# Patient Record
Sex: Female | Born: 1945 | Race: White | Hispanic: No | Marital: Married
Health system: Midwestern US, Community
[De-identification: ages and names within clinical notes are randomized; demographics above are authoritative.]

## PROBLEM LIST (undated history)

## (undated) DIAGNOSIS — M545 Low back pain, unspecified: Secondary | ICD-10-CM

## (undated) DIAGNOSIS — Z1211 Encounter for screening for malignant neoplasm of colon: Secondary | ICD-10-CM

## (undated) DIAGNOSIS — F17290 Nicotine dependence, other tobacco product, uncomplicated: Secondary | ICD-10-CM

## (undated) DIAGNOSIS — I6529 Occlusion and stenosis of unspecified carotid artery: Secondary | ICD-10-CM

## (undated) DIAGNOSIS — I6523 Occlusion and stenosis of bilateral carotid arteries: Secondary | ICD-10-CM

## (undated) MED ORDER — ZOSTER VACCINE LIVE (PF) 19,400 UNIT SUB-Q SOLN
19400 unit/0.65 mL | Freq: Once | SUBCUTANEOUS | Status: DC
Start: ? — End: 2012-08-15

## (undated) MED ORDER — LEVOTHYROXINE 100 MCG TAB
100 mcg | ORAL_TABLET | Freq: Every day | ORAL | Status: DC
Start: ? — End: 2012-08-15

## (undated) MED ORDER — METFORMIN 850 MG TAB: 850 mg | ORAL_TABLET | Freq: Two times a day (BID) | ORAL | Status: AC

## (undated) MED ORDER — GABAPENTIN 400 MG CAP: 400 mg | ORAL_CAPSULE | Freq: Three times a day (TID) | ORAL | Status: AC

## (undated) MED ORDER — LISINOPRIL 20 MG TAB: 20 mg | ORAL_TABLET | Freq: Every day | ORAL | Status: AC

## (undated) MED ORDER — ERGOCALCIFEROL (VITAMIN D2) 50,000 UNIT CAP
1250 mcg (50,000 unit) | ORAL_CAPSULE | ORAL | Status: DC
Start: ? — End: 2013-08-21

## (undated) MED ORDER — SIMVASTATIN 20 MG TAB
20 mg | ORAL_TABLET | Freq: Every evening | ORAL | Status: DC
Start: ? — End: 2019-10-15

## (undated) MED ORDER — OXYCODONE-ACETAMINOPHEN 5 MG-325 MG TAB
5-325 mg | ORAL_TABLET | Freq: Three times a day (TID) | ORAL | Status: DC | PRN
Start: ? — End: 2019-10-15

## (undated) MED ORDER — ASPIRIN 81 MG TAB, DELAYED RELEASE: 81 mg | ORAL_TABLET | Freq: Every day | ORAL | Status: AC

## (undated) MED ORDER — ZOSTER VACCINE LIVE (PF) 19,400 UNIT SUB-Q SOLN
19400 unit/0.65 mL | Freq: Once | SUBCUTANEOUS | Status: AC
Start: ? — End: 2012-08-15

## (undated) MED ORDER — LEVOTHYROXINE 88 MCG TAB: 88 mcg | ORAL_TABLET | Freq: Every day | ORAL | Status: AC

---

## 2012-07-30 NOTE — Progress Notes (Signed)
Chief Complaint   Patient presents with   ??? Establish Care     new patient   ??? Rash     elbow dry skin and itchy   ??? LOW BACK PAIN

## 2012-07-30 NOTE — Progress Notes (Signed)
HISTORY OF PRESENT ILLNESS  Katrina Rogers is a 67 y.o. female.  HPI  Hypothyrodism  No wt gain, no cold intolerance, no thyroid sx,  nl lipid, compliant with meds, last TSH was normal, was also told to have increased glucose level as well.  HTN  Compliant w/ meds, no low salt diet, and ++daily walking, non smoker, no home bp monitoring .No swelling no lightheadedness as per patient, not stressed out, ++ RF needed today, otherwise feeling better since the last visit  Back pain  The history is provided by the patient. This is a chronic problem. Episode onset: 3 yrs ago,not morbid obese, she is not working, she is having trouble with ironing, washing dishes and hang clothes, and it is worsens going up and down the steps . The problem occurs constantly. The problem has changed and worsening since onset. The pain is present in the lower back. The quality of the pain is described as dull. The pain is at a severity of 8/10. Associated symptoms include limited range of motion. Pertinent negatives include no numbness,++ stiffness, ++ tingling b/l, no itching, + back pain and no neck pain. [place], had couple of surgical repair and the pain persisted since then, currently on Neurontine and Percocet often for the last couple of yrs, The symptoms are aggravated by movement and palpation. There has been no history of extremity trauma. no incontinence of urine nor of stool    Current Outpatient Prescriptions   Medication Sig Dispense Refill   ??? lisinopril (PRINIVIL, ZESTRIL) 20 mg tablet Take  by mouth daily.       ??? gabapentin (NEURONTIN) 100 mg capsule Take 200 mg by mouth three (3) times daily.       ??? levothyroxine (SYNTHROID) 100 mcg tablet Take  by mouth Daily (before breakfast).       ??? oxyCODONE-acetaminophen (PERCOCET) 5-325 mg per tablet Take 1 Tab by mouth every four (4) hours as needed for Pain.         Allergies   Allergen Reactions   ??? Ceclor (Cefaclor)  Anaphylaxis   ??? Sulfa (Sulfonamide Antibiotics) Anaphylaxis   ??? Nasonex (Mometasone) Rash     Past Medical History   Diagnosis Date   ??? Hypertension    ??? Thromboembolus      Past Surgical History   Procedure Laterality Date   ??? Hx back surgery Left 2012   ??? Hx thyroidectomy Bilateral 1994     Radioactively     History reviewed. No pertinent family history.  History   Substance Use Topics   ??? Smoking status: Current Every Day Smoker -- 1.00 packs/day for 50 years   ??? Smokeless tobacco: Never Used   ??? Alcohol Use: No        Review of Systems   Constitutional: Positive for malaise/fatigue. Negative for fever and chills.   HENT: Negative for ear pain and nosebleeds.    Eyes: Negative for blurred vision, pain and discharge.   Respiratory: Negative for shortness of breath.    Cardiovascular: Negative for chest pain and leg swelling.   Gastrointestinal: Negative for nausea, vomiting, diarrhea and constipation.   Genitourinary: Negative for frequency.   Musculoskeletal: Positive for myalgias, back pain and joint pain.   Skin: Negative for itching and rash.   Neurological: Negative for headaches.   Psychiatric/Behavioral: Negative for depression. The patient is nervous/anxious.    BP 139/89   Pulse 88  Resp 16   Ht 5\' 2"  (1.575 m)   Wt 143 lb (64.864 kg)   BMI 26.15 kg/m2   SpO2 93%      Physical Exam   Nursing note and vitals reviewed.  Constitutional: She is oriented to person, place, and time. She appears well-developed and well-nourished.   HENT:   Head: Normocephalic and atraumatic.   Eyes: Conjunctivae and EOM are normal.   Neck: Normal range of motion. Neck supple.   Cardiovascular: Normal rate, regular rhythm and normal heart sounds.    No murmur heard.  Pulmonary/Chest: Effort normal and breath sounds normal.   Abdominal: Soft. Bowel sounds are normal. She exhibits no distension.   Musculoskeletal: She exhibits tenderness. She exhibits no edema.        Thoracic back: She exhibits decreased range of motion,  tenderness, pain and spasm.        Lumbar back: She exhibits decreased range of motion, tenderness, pain and spasm.   Lymphadenopathy:     She has no cervical adenopathy.   Neurological: She is alert and oriented to person, place, and time.   Skin: No erythema.   Psychiatric: Her behavior is normal.       ASSESSMENT and PLAN  Katrina Rogers was seen today for establish care, rash and low back pain.    Diagnoses and associated orders for this visit:    HTN, goal below 130/80  - Discontinue: lisinopril (PRINIVIL, ZESTRIL) 20 mg tablet; Take  by mouth daily.  - Discontinue: gabapentin (NEURONTIN) 100 mg capsule; Take 200 mg by mouth three (3) times daily.  - Discontinue: levothyroxine (SYNTHROID) 100 mcg tablet; Take  by mouth Daily (before breakfast).  - Discontinue: oxyCODONE-acetaminophen (PERCOCET) 5-325 mg per tablet; Take 1 Tab by mouth every four (4) hours as needed for Pain.  - CBC W/O DIFF  - TSH, 3RD GENERATION  - HEMOGLOBIN A1C  - LIPID PANEL  - METABOLIC PANEL, COMPREHENSIVE  - lisinopril (PRINIVIL, ZESTRIL) 20 mg tablet; Take 1 Tab by mouth daily.  - levothyroxine (SYNTHROID) 100 mcg tablet; Take 1 Tab by mouth Daily (before breakfast).  - oxyCODONE-acetaminophen (PERCOCET) 5-325 mg per tablet; Take 1 Tab by mouth every eight (8) hours as needed for Pain.    Hyperlipidemia LDL goal < 100  - Discontinue: lisinopril (PRINIVIL, ZESTRIL) 20 mg tablet; Take  by mouth daily.  - Discontinue: gabapentin (NEURONTIN) 100 mg capsule; Take 200 mg by mouth three (3) times daily.  - Discontinue: levothyroxine (SYNTHROID) 100 mcg tablet; Take  by mouth Daily (before breakfast).  - Discontinue: oxyCODONE-acetaminophen (PERCOCET) 5-325 mg per tablet; Take 1 Tab by mouth every four (4) hours as needed for Pain.  - CBC W/O DIFF  - TSH, 3RD GENERATION  - HEMOGLOBIN A1C  - LIPID PANEL  - METABOLIC PANEL, COMPREHENSIVE  - lisinopril (PRINIVIL, ZESTRIL) 20 mg tablet; Take 1 Tab by mouth daily.  - levothyroxine (SYNTHROID) 100 mcg  tablet; Take 1 Tab by mouth Daily (before breakfast).  - oxyCODONE-acetaminophen (PERCOCET) 5-325 mg per tablet; Take 1 Tab by mouth every eight (8) hours as needed for Pain.    Hypothyroidism  - Discontinue: lisinopril (PRINIVIL, ZESTRIL) 20 mg tablet; Take  by mouth daily.  - Discontinue: gabapentin (NEURONTIN) 100 mg capsule; Take 200 mg by mouth three (3) times daily.  - Discontinue: levothyroxine (SYNTHROID) 100 mcg tablet; Take  by mouth Daily (before breakfast).  - Discontinue: oxyCODONE-acetaminophen (PERCOCET) 5-325 mg per tablet; Take 1 Tab by mouth every four (4)  hours as needed for Pain.  - CBC W/O DIFF  - TSH, 3RD GENERATION  - HEMOGLOBIN A1C  - LIPID PANEL  - METABOLIC PANEL, COMPREHENSIVE  - lisinopril (PRINIVIL, ZESTRIL) 20 mg tablet; Take 1 Tab by mouth daily.  - levothyroxine (SYNTHROID) 100 mcg tablet; Take 1 Tab by mouth Daily (before breakfast).  - oxyCODONE-acetaminophen (PERCOCET) 5-325 mg per tablet; Take 1 Tab by mouth every eight (8) hours as needed for Pain.    History of elevated glucose  - Discontinue: lisinopril (PRINIVIL, ZESTRIL) 20 mg tablet; Take  by mouth daily.  - Discontinue: gabapentin (NEURONTIN) 100 mg capsule; Take 200 mg by mouth three (3) times daily.  - Discontinue: levothyroxine (SYNTHROID) 100 mcg tablet; Take  by mouth Daily (before breakfast).  - Discontinue: oxyCODONE-acetaminophen (PERCOCET) 5-325 mg per tablet; Take 1 Tab by mouth every four (4) hours as needed for Pain.  - CBC W/O DIFF  - TSH, 3RD GENERATION  - HEMOGLOBIN A1C  - LIPID PANEL  - METABOLIC PANEL, COMPREHENSIVE  - lisinopril (PRINIVIL, ZESTRIL) 20 mg tablet; Take 1 Tab by mouth daily.  - levothyroxine (SYNTHROID) 100 mcg tablet; Take 1 Tab by mouth Daily (before breakfast).  - oxyCODONE-acetaminophen (PERCOCET) 5-325 mg per tablet; Take 1 Tab by mouth every eight (8) hours as needed for Pain.    Chronic back pain greater than 3 months duration  - Discontinue: lisinopril (PRINIVIL, ZESTRIL) 20 mg  tablet; Take  by mouth daily.  - Discontinue: gabapentin (NEURONTIN) 100 mg capsule; Take 200 mg by mouth three (3) times daily.  - Discontinue: levothyroxine (SYNTHROID) 100 mcg tablet; Take  by mouth Daily (before breakfast).  - Discontinue: oxyCODONE-acetaminophen (PERCOCET) 5-325 mg per tablet; Take 1 Tab by mouth every four (4) hours as needed for Pain.  - CBC W/O DIFF  - TSH, 3RD GENERATION  - HEMOGLOBIN A1C  - LIPID PANEL  - METABOLIC PANEL, COMPREHENSIVE  - VITAMIN D, 25 HYDROXY  - lisinopril (PRINIVIL, ZESTRIL) 20 mg tablet; Take 1 Tab by mouth daily.  - levothyroxine (SYNTHROID) 100 mcg tablet; Take 1 Tab by mouth Daily (before breakfast).  - oxyCODONE-acetaminophen (PERCOCET) 5-325 mg per tablet; Take 1 Tab by mouth every eight (8) hours as needed for Pain.  - gabapentin (NEURONTIN) 400 mg capsule; Take 1 Cap by mouth three (3) times daily.    Vitamin D deficiency  - VITAMIN D, 25 HYDROXY      stretching x2 daily for 5-10 min, rom strengthening with resistance banding 3-4 times per week, avoid heavy lifting and pushing, avoid machinary operation and driving while on any medication that could potentially cause dizziness, help with weight reduction, take meds w/ food and water, stool softner if opioid base meds given, dependency and tolerancy were also addressed,  meds side effects and compliance advised, rtc if worsens.    reviewed diet, exercise and weight control  very strongly urged to quit smoking to reduce cardiovascular risk  cardiovascular risk and specific lipid/LDL goals reviewed  reviewed medications and side effects in detail  use of aspirin to prevent MI and TIA's discussed

## 2012-07-31 LAB — TSH 3RD GENERATION: TSH: 0.38 u[IU]/mL — ABNORMAL LOW (ref 0.450–4.500)

## 2012-07-31 LAB — METABOLIC PANEL, COMPREHENSIVE
A-G Ratio: 1.8 (ref 1.1–2.5)
ALT (SGPT): 8 IU/L (ref 0–32)
AST (SGOT): 11 IU/L (ref 0–40)
Albumin: 4.6 g/dL (ref 3.6–4.8)
Alk. phosphatase: 102 IU/L (ref 39–117)
BUN/Creatinine ratio: 11 (ref 11–26)
BUN: 11 mg/dL (ref 8–27)
Bilirubin, total: 0.3 mg/dL (ref 0.0–1.2)
CO2: 25 mmol/L (ref 18–29)
Calcium: 10.2 mg/dL (ref 8.6–10.2)
Chloride: 99 mmol/L (ref 97–108)
Creatinine: 0.96 mg/dL (ref 0.57–1.00)
GFR est AA: 71 mL/min/{1.73_m2} (ref >59)
GFR est non-AA: 61 mL/min/{1.73_m2} (ref >59)
GLOBULIN, TOTAL: 2.5 g/dL (ref 1.5–4.5)
Glucose: 166 mg/dL — ABNORMAL HIGH (ref 65–99)
Potassium: 5.2 mmol/L (ref 3.5–5.2)
Protein, total: 7.1 g/dL (ref 6.0–8.5)
Sodium: 141 mmol/L (ref 134–144)

## 2012-07-31 LAB — LIPID PANEL
Cholesterol, total: 323 mg/dL — ABNORMAL HIGH (ref 100–199)
HDL Cholesterol: 42 mg/dL (ref >39)
Triglyceride: 446 mg/dL — ABNORMAL HIGH (ref 0–149)

## 2012-07-31 LAB — CVD REPORT: PDF IMAGE: 0

## 2012-07-31 LAB — CBC W/O DIFF
HCT: 45 % (ref 34.0–46.6)
HGB: 15.2 g/dL (ref 11.1–15.9)
MCH: 32 pg (ref 26.6–33.0)
MCHC: 33.8 g/dL (ref 31.5–35.7)
MCV: 95 fL (ref 79–97)
PLATELET: 272 10*3/uL (ref 150–379)
RBC: 4.75 x10E6/uL (ref 3.77–5.28)
RDW: 14 % (ref 12.3–15.4)
WBC: 12.4 10*3/uL — ABNORMAL HIGH (ref 3.4–10.8)

## 2012-07-31 LAB — VITAMIN D, 25 HYDROXY: VITAMIN D, 25-HYDROXY: 14 ng/mL — ABNORMAL LOW (ref 30.0–100.0)

## 2012-07-31 LAB — HEMOGLOBIN A1C WITH EAG: Hemoglobin A1c: 8 % — ABNORMAL HIGH (ref 4.8–5.6)

## 2012-08-15 NOTE — Progress Notes (Signed)
Patient stated today that Dr. Demetrios Isaacs wasn't listening to her and that she wanted to get the [employer] paperwork done but she was only put on the schedule for [employer] [employer] visit. Dr. Demetrios Isaacs advised her that she needs to schedule a separate office visit for this to be properly documented.

## 2012-08-15 NOTE — Patient Instructions (Addendum)
Medicare Part B Preventive Services Limitations Recommendation Scheduled   Bone Mass Measurement  (age 67 & older, biennial) Requires diagnosis related to osteoporosis or estrogen deficiency. Biennial benefit unless patient has history of long-term glucocorticoid tx or baseline is needed because initial test was by other method biennial Awaiting records from PCP in [place]   Cardiovascular Screening Blood Tests (every 5 years)  Total cholesterol, HDL, Triglycerides Order as a panel if possible Every 5 years  07/30/2012 07/2017   Per Dr. Demetrios Isaacs   Colorectal Cancer Screening  -Fecal occult blood test (annual)  -Flexible sigmoidoscopy (5y)  -Screening colonoscopy (10y)  -Barium Enema      Annually          2008 in [place]     08/2013          2018   Counseling to Prevent Tobacco Use (up to 8 sessions per year)  - Counseling greater than 3 and up to 10 minutes  - Counseling greater than 10 minutes Patients must be asymptomatic of tobacco-related conditions to receive as preventive service Current smoker    Diabetes Screening Tests (at least every 3 years, Medicare covers annually or at 10-month intervals for prediabetic patients)    Fasting blood sugar (FBS) or glucose tolerance test (GTT) Patient must be diagnosed with one of the following:  -Hypertension, Dyslipidemia, obesity, previous impaired FBS or GTT  ???Or any two of the following: overweight, FH of diabetes, age ?65, history of gestational diabetes, birth of baby weighing more than 9 pounds     Diabetes Self-Management Training (DSMT) (no USPSTF recommendation) Requires referral by treating physician for patient with diabetes or renal disease. 10 hours of initial DSMT session of no less than 30 minutes each in a continuous 74-month period.  2 hours of follow-up DSMT in subsequent years.     Glaucoma Screening (no USPSTF recommendation) Diabetes mellitus, family history, African American, age 53 or over, Hispanic American, age 7 or over     Human Immunodeficiency Virus  (HIV) Screening (annually for increased risk patients)  HIV-1 and HIV-2 by EIA, ELISA, rapid antibody test, or oral mucosa transudate Patient must be at increased risk for HIV infection per USPSTF guidelines or pregnant.  Tests covered annually for patients at increased risk.  Pregnant patients may receive up to 3 test during pregnancy.     Medical Nutrition Therapy (MNT) (for diabetes or renal disease not recommended schedule) Requires referral by treating physician for patient with diabetes or renal disease.  Can be provided in same year as diabetes self-management training (DSMT), and CMS recommends medical nutrition therapy take place after DSMT.  Up to 3 hours for initial year and 2 hours in subsequent years.     Prostate Cancer Screening (annually up to age 69)  - Digital rectal exam (DRE)  - Prostate specific antigen (PSA) Annually (age 84 or over), DRE not paid separately when covered E/M service is provided on same date     Seasonal Influenza Vaccination (annually)      Pneumococcal Vaccination (once after 65)      Hepatitis B Vaccinations (if medium/high risk) Medium/high risk factors:  End-stage renal disease,  Hemophiliacs who received Factor VIII or IX concentrates, Clients of institutions for the mentally retarded, Persons who live in the same house as a HepB virus carrier, Homosexual men, Illicit injectable drug abusers.     Screening Mammography (biennial age 41-74)? Annually (age 75 or over)     Screening Pap Tests and Pelvic Examination (  up to age 59 and after 32 if unknown history or abnormal study last 10 years) Every 24 months except high risk     Ultrasound Screening for Abdominal Aortic Aneurysm (AAA) (once) Patient must be referred through IPPE and not have had a screening for abdominal aortic aneurysm before under Medicare.  Limited to patients who meet one of the following criteria:  - Men who are 19-55 years old and have smoked more than 100 cigarettes in their lifetime.  -Anyone with a FH  of AAA  -Anyone recommended for screening by USPSTF     Family Practice Management 2011    Health Maintenance Due   Topic Date Due   ??? Breast Cancer Screening  05/08/1995   ??? Stool Testing For Trace Blood  05/08/1995   ??? Shingles Vaccine  05/07/2005   ??? Glaucoma Screening   05/08/2010   ??? Pneumonia Vaccine  05/08/2010   ??? Bone Density Screening  05/08/2010   ??? Annual Well Visit  05/08/2010       Nutrition Tips for Diabetes: After Your Visit  Your Care Instructions  A healthy diet is important to manage diabetes. It helps you lose weight (if you need to) and keep it off. It gives you the nutrition and energy your body needs and helps prevent heart disease. But a diet for diabetes does not mean that you have to eat special foods. You can eat what your family eats, including occasional sweets and other favorites. But you do have to pay attention to how often you eat and how much you eat of certain foods. The right plan for you will give you meals that help you keep your blood sugar at healthy levels.  Try to eat a variety of foods and to spread carbohydrate throughout the day. Carbohydrate raises blood sugar higher and more quickly than any other nutrient does. Carbohydrate is found in sugar, breads and cereals, fruit, starchy vegetables such as potatoes and corn, and milk and yogurt.  You may want to work with a dietitian or diabetes educator to help you plan meals and snacks. A dietitian or diabetes educator also can help you lose weight if that is one of your goals. The following tips can help you enjoy your meals and stay healthy.  Follow-up care is a key part of your treatment and safety. Be sure to make and go to all appointments, and call your doctor if you are having problems. It???s also a good idea to know your test results and keep a list of the medicines you take.  How can you care for yourself at home?  ?? Learn which foods have carbohydrate and how much carbohydrate to eat. A dietitian or diabetes educator can help  you learn to keep track of how much carbohydrate you eat.  ?? Spread carbohydrate throughout the day. Eat some carbohydrate at all meals, but do not eat too much at any one time.  ?? Plan meals to include food from all the food groups. These are the food groups and some example portion sizes:  ?? Grains: 1 slice of bread (1 ounce), ?? cup of cooked cereal, and 1/3 cup of cooked pasta or rice. These have about 15 grams of carbohydrate in a serving. Choose whole grains such as whole wheat bread or crackers, oatmeal, and brown rice more often than refined grains.  ?? Fruit: 1 small fresh fruit, such as an apple or orange; ?? of a banana; ?? cup of chopped, cooked, or canned  fruit; ?? cup of fruit juice; 1 cup of melon or raspberries; and 2 tablespoons of dried fruit. These have about 15 grams of carbohydrate in a serving.  ?? Dairy: 1 cup of nonfat or low-fat milk and 2/3 cup of plain yogurt. These have about 15 grams of carbohydrate in a serving.  ?? Protein foods: Beef, chicken, [place], fish, eggs, tofu, cheese, cottage cheese, and peanut butter. A serving size of meat is 3 ounces, which is about the size of a deck of cards. Examples of meat substitute serving sizes (equal to 1 ounce of meat) are 1/4 cup of cottage cheese, 1 egg, 1 tablespoon of peanut butter, and ?? cup of tofu. These have very little or no carbohydrate per serving.  ?? Vegetables: Starchy vegetables such as ?? cup of cooked dried beans, peas, potatoes, or corn have about 15 grams of carbohydrate. Nonstarchy vegetables have very little carbohydrate, such as 1 cup of raw leafy vegetables (such as spinach), ?? cup of other vegetables (cooked or chopped), and 3/4 cup of vegetable juice.  ?? Use the plate format to plan meals. It is a good, quick way to make sure that you have a balanced meal. It also helps you spread carbohydrate throughout the day. You divide your plate by types of foods. Put vegetables on half the plate, meat or meat substitutes on one-quarter of  the plate, and a grain or starchy vegetable (such as brown rice or a potato) in the final quarter of the plate. To this you can add a small piece of fruit and 1 cup of milk or yogurt, depending on how much carbohydrate you are supposed to eat at a meal.  ?? Talk to your dietitian or diabetes educator about ways to add limited amounts of sweets into your meal plan. You can eat these foods now and then, as long as you include the amount of carbohydrate they have in your daily carbohydrate allowance.  ?? If you drink alcohol, limit it to no more than 1 drink a day for women and 2 drinks a day for men. If you are pregnant, no amount of alcohol is known to be safe.  ?? Protein, fat, and fiber do not raise blood sugar as much as carbohydrate does. If you eat a lot of these nutrients in a meal, your blood sugar will rise more slowly than it would otherwise.  ?? Limit saturated fats, such as those from meat and dairy products. Try to replace it with monounsaturated fat, such as olive oil. This is a healthier choice because people who have diabetes are at higher-than-average risk of heart disease. But use a modest amount of olive oil. A tablespoon of olive oil has 14 grams of fat and 120 calories.  ?? Exercise lowers blood sugar. If you take insulin by shots or pump, you can use less than you would if you were not exercising. Keep in mind that timing matters. If you exercise within 1 hour after a meal, your body may need less insulin for that meal than it would if you exercised 3 hours after the meal. Test your blood sugar to find out how exercise affects your need for insulin.  ?? Exercise on most days of the week. Aim for at least 30 minutes. Exercise helps you stay at a healthy weight and helps your body use insulin. Walking is an easy way to get exercise. Gradually increase the amount you walk every day. You also may want to swim, bike, or do  other activities.  When you eat out  ?? Learn to estimate the serving sizes of foods  that have carbohydrate. If you measure food at home, it will be easier to estimate the amount in a serving of restaurant food.  ?? If the meal you order has too much carbohydrate (such as potatoes, corn, or baked beans), ask to have a low-carbohydrate food instead. Ask for a salad or green vegetables.  ?? If you use insulin, check your blood sugar before and after eating out to help you plan how much to eat in the future.  ?? If you eat more carbohydrate at a meal than you had planned, take a walk or do other exercise. This will help lower your blood sugar.   Where can you learn more?   Go to [email or web address]  Enter [phone] in the search box to learn more about "Nutrition Tips for Diabetes: After Your Visit."   ?? 2006-2014 [employer]. Care instructions adapted under license by [employer] (which disclaims liability or warranty for this information). This care instruction is for use with your licensed healthcare professional. If you have questions about a medical condition or this instruction, always ask your healthcare professional. [employer] disclaims any warranty or liability for your use of this information.  Content Version: 10.1.311062; Current as of: August 14, 2011              Diabetes and Alcohol: After Your Visit  Your Care Instructions  People who have diabetes need to be more careful with alcohol. Before you drink, consider a few things: Is your diabetes well controlled? Do you know how drinking alcohol can affect you? Do you have high blood pressure, nerve damage, or eye problems from your diabetes?  If you take insulin or pills for diabetes, drinking alcohol may cause low blood sugar. This could cause dangerous low blood sugar levels.  Too much alcohol can also affect your ability to know your blood sugar is low and to treat it. Drinking alcohol can make you lightheaded at first and drowsy as you drink more, both of which may be similar to the symptoms of low  blood sugar.  Drinking a lot of alcohol over a long period of time can damage your liver (cirrhosis). If this happens, your body may lose its natural response to protect itself from low blood sugar.  If you are controlling your diabetes and do not have other health issues, it may be okay to have a drink once in a while. Learning how alcohol affects your body can help you make the right choices.  Follow-up care is a key part of your treatment and safety. Be sure to make and go to all appointments, and call your doctor if you are having problems. It's also a good idea to know your test results and keep a list of the medicines you take.  How can you care for yourself at home?  If you drink  ?? Work with your doctor or other diabetes expert to find what is best for you. Make sure you know whether it is safe to drink if you are taking insulin or pills.  ?? In general, limit alcohol to 1 drink a day with a meal if you are a woman. If you are a man, limit alcohol to 2 drinks a day with a meal. The following is considered a standard drink:  ?? One 12-ounce bottle of beer or wine cooler  ?? One 5-ounce glass of wine  ??  One mixed drink with 1.5 ounces of 80-proof hard liquor, such as gin, whiskey, or rum  ?? Choose alcoholic drinks wisely. With hard alcohol, use sugar-free mixers, such as diet tonic, water, or club soda. Pick drinks that have less alcohol, including light beer or dry wine. Or add club soda to wine to dilute it. Also remember that most alcoholic drinks have a lot of calories.  ?? When you drink, check your blood sugar before you go to bed. Have a snack before bed so your blood sugar does not drop while you sleep.  When not to drink  ?? Never drink on an empty stomach. If you do drink alcohol, drink it only with a meal or snack. Having as little as 2 drinks on an empty stomach could lead to low blood sugar.  ?? Do not drink alcohol if you have problems recognizing the signs of low blood sugar until they become severe.   ?? Do not drink alcohol after you exercise. The exercise itself lowers blood sugar.  ?? Do not drink if you have nerve damage. Drinking can make it worse and increase the pain, numbness, and other symptoms.  ?? Do not drink if you have high blood pressure.  ?? Do not drink if you have diabetic eye disease.  ?? Do not drink if you have high triglycerides, a type of fat in your blood. Drinking can raise triglycerides.  ?? Do not drink if you are trying to lose weight. Alcohol provides empty calories that do not give you any nutrients.  ?? Do not drink and drive. The effects of alcohol are greater if you have low blood sugar.  When should you call for help?  Call 911 anytime you think you may need emergency care. For example, call if:  ?? You passed out (lost consciousness), or you suddenly become very sleepy or confused. (You may have very low blood sugar.)  ?? You have symptoms of high blood sugar, such as:  ?? Blurred vision.  ?? Trouble staying awake or being woken up.  ?? Fast, deep breathing.  ?? Breath that smells fruity.  ?? Belly pain, not feeling hungry, and vomiting.  ?? Feeling confused.  Call your doctor now or seek immediate medical care if:  ?? You are sick and cannot control your blood sugar.  ?? You have been vomiting or have had diarrhea for more than 6 hours.  ?? Your blood sugar stays higher than the level your doctor has set for you.  ?? You have symptoms of low blood sugar, such as:  ?? Sweating.  ?? Feeling nervous, shaky, and weak.  ?? Extreme hunger and slight nausea.  ?? Dizziness and headache.  ?? Blurred vision.  ?? Confusion.  Watch closely for changes in your health, and be sure to contact your doctor if:  ?? You have a hard time knowing when your blood sugar is low.  ?? You have trouble keeping your blood sugar in the target range.  ?? You often have problems controlling your blood sugar.  ?? You have symptoms of long-term diabetes problems, such as:  ?? New vision changes.  ?? New pain, numbness, or tingling in your  hands or feet.  ?? Skin problems.   Where can you learn more?   Go to [email or web address]  Enter T236 in the search box to learn more about "Diabetes and Alcohol: After Your Visit."   ?? 2006-2014 [employer]. Care instructions adapted under license by [employer] (which disclaims  liability or warranty for this information). This care instruction is for use with your licensed healthcare professional. If you have questions about a medical condition or this instruction, always ask your healthcare professional. [employer] disclaims any warranty or liability for your use of this information.  Content Version: 10.1.311062; Current as of: October 16, 2011              Nutrition Tips for Diabetes: After Your Visit  Your Care Instructions  A healthy diet is important to manage diabetes. It helps you lose weight (if you need to) and keep it off. It gives you the nutrition and energy your body needs and helps prevent heart disease. But a diet for diabetes does not mean that you have to eat special foods. You can eat what your family eats, including occasional sweets and other favorites. But you do have to pay attention to how often you eat and how much you eat of certain foods. The right plan for you will give you meals that help you keep your blood sugar at healthy levels.  Try to eat a variety of foods and to spread carbohydrate throughout the day. Carbohydrate raises blood sugar higher and more quickly than any other nutrient does. Carbohydrate is found in sugar, breads and cereals, fruit, starchy vegetables such as potatoes and corn, and milk and yogurt.  You may want to work with a dietitian or diabetes educator to help you plan meals and snacks. A dietitian or diabetes educator also can help you lose weight if that is one of your goals. The following tips can help you enjoy your meals and stay healthy.  Follow-up care is a key part of your treatment and safety. Be sure to make  and go to all appointments, and call your doctor if you are having problems. It???s also a good idea to know your test results and keep a list of the medicines you take.  How can you care for yourself at home?  ?? Learn which foods have carbohydrate and how much carbohydrate to eat. A dietitian or diabetes educator can help you learn to keep track of how much carbohydrate you eat.  ?? Spread carbohydrate throughout the day. Eat some carbohydrate at all meals, but do not eat too much at any one time.  ?? Plan meals to include food from all the food groups. These are the food groups and some example portion sizes:  ?? Grains: 1 slice of bread (1 ounce), ?? cup of cooked cereal, and 1/3 cup of cooked pasta or rice. These have about 15 grams of carbohydrate in a serving. Choose whole grains such as whole wheat bread or crackers, oatmeal, and brown rice more often than refined grains.  ?? Fruit: 1 small fresh fruit, such as an apple or orange; ?? of a banana; ?? cup of chopped, cooked, or canned fruit; ?? cup of fruit juice; 1 cup of melon or raspberries; and 2 tablespoons of dried fruit. These have about 15 grams of carbohydrate in a serving.  ?? Dairy: 1 cup of nonfat or low-fat milk and 2/3 cup of plain yogurt. These have about 15 grams of carbohydrate in a serving.  ?? Protein foods: Beef, chicken, [place], fish, eggs, tofu, cheese, cottage cheese, and peanut butter. A serving size of meat is 3 ounces, which is about the size of a deck of cards. Examples of meat substitute serving sizes (equal to 1 ounce of meat) are 1/4 cup of cottage cheese, 1 egg, 1  tablespoon of peanut butter, and ?? cup of tofu. These have very little or no carbohydrate per serving.  ?? Vegetables: Starchy vegetables such as ?? cup of cooked dried beans, peas, potatoes, or corn have about 15 grams of carbohydrate. Nonstarchy vegetables have very little carbohydrate, such as 1 cup of raw leafy vegetables (such as spinach), ?? cup of other vegetables (cooked or  chopped), and 3/4 cup of vegetable juice.  ?? Use the plate format to plan meals. It is a good, quick way to make sure that you have a balanced meal. It also helps you spread carbohydrate throughout the day. You divide your plate by types of foods. Put vegetables on half the plate, meat or meat substitutes on one-quarter of the plate, and a grain or starchy vegetable (such as brown rice or a potato) in the final quarter of the plate. To this you can add a small piece of fruit and 1 cup of milk or yogurt, depending on how much carbohydrate you are supposed to eat at a meal.  ?? Talk to your dietitian or diabetes educator about ways to add limited amounts of sweets into your meal plan. You can eat these foods now and then, as long as you include the amount of carbohydrate they have in your daily carbohydrate allowance.  ?? If you drink alcohol, limit it to no more than 1 drink a day for women and 2 drinks a day for men. If you are pregnant, no amount of alcohol is known to be safe.  ?? Protein, fat, and fiber do not raise blood sugar as much as carbohydrate does. If you eat a lot of these nutrients in a meal, your blood sugar will rise more slowly than it would otherwise.  ?? Limit saturated fats, such as those from meat and dairy products. Try to replace it with monounsaturated fat, such as olive oil. This is a healthier choice because people who have diabetes are at higher-than-average risk of heart disease. But use a modest amount of olive oil. A tablespoon of olive oil has 14 grams of fat and 120 calories.  ?? Exercise lowers blood sugar. If you take insulin by shots or pump, you can use less than you would if you were not exercising. Keep in mind that timing matters. If you exercise within 1 hour after a meal, your body may need less insulin for that meal than it would if you exercised 3 hours after the meal. Test your blood sugar to find out how exercise affects your need for insulin.  ?? Exercise on most days of the  week. Aim for at least 30 minutes. Exercise helps you stay at a healthy weight and helps your body use insulin. Walking is an easy way to get exercise. Gradually increase the amount you walk every day. You also may want to swim, bike, or do other activities.  When you eat out  ?? Learn to estimate the serving sizes of foods that have carbohydrate. If you measure food at home, it will be easier to estimate the amount in a serving of restaurant food.  ?? If the meal you order has too much carbohydrate (such as potatoes, corn, or baked beans), ask to have a low-carbohydrate food instead. Ask for a salad or green vegetables.  ?? If you use insulin, check your blood sugar before and after eating out to help you plan how much to eat in the future.  ?? If you eat more carbohydrate at a meal  than you had planned, take a walk or do other exercise. This will help lower your blood sugar.   Where can you learn more?   Go to [email or web address]  Enter [phone] in the search box to learn more about "Nutrition Tips for Diabetes: After Your Visit."   ?? 2006-2014 [employer]. Care instructions adapted under license by [employer] (which disclaims liability or warranty for this information). This care instruction is for use with your licensed healthcare professional. If you have questions about a medical condition or this instruction, always ask your healthcare professional. [employer] disclaims any warranty or liability for your use of this information.  Content Version: 10.1.311062; Current as of: August 14, 2011              Hyperlipidemia: After Your Visit  Your Care Instructions  Hyperlipidemia is too much fat in your blood. The body has several kinds of fat, including cholesterol and triglycerides. Your body needs fat for many things, such as making new cells. But too much fat in your blood increases your chances of having a heart attack or stroke.  You may be able to lower your cholesterol  and triglycerides with a heart-healthy diet, exercise, and if needed, medicine. Your doctor may want you to try lifestyle changes first to see whether they lower the fat in your blood. You may need to take medicine if lifestyle changes do not lower the fat in your blood enough.  Follow-up care is a key part of your treatment and safety. Be sure to make and go to all appointments, and call your doctor if you are having problems. It???s also a good idea to know your test results and keep a list of the medicines you take.  How can you care for yourself at home?  Take your medicines  ?? Take your medicines exactly as prescribed. Call your doctor if you think you are having a problem with your medicine.  ?? If you take medicine to lower your cholesterol, go to follow-up visits. You will need to have blood tests.  ?? Do not take large doses of niacin, which is a B vitamin, while taking medicine called statins. It may increase the chance of muscle pain and liver problems.  ?? Talk to your doctor about avoiding grapefruit juice if you are taking statins. Grapefruit juice can raise the level of this medicine in your blood. This could increase side effects.  Eat more fruits, vegetables, and fiber  ?? Fruits and vegetables have lots of nutrients that help protect against heart disease, and they have little???if any???fat. Try to eat at least five servings a day. Dark green, deep orange, or yellow fruits and vegetables are healthy choices.  ?? Keep carrots, celery, and other veggies handy for snacks. Buy fruit that is in season and store it where you can see it so that you will be tempted to eat it. Cook dishes that have a lot of veggies in them, such as stir-fries and soups.  ?? Foods high in fiber may reduce your cholesterol and provide important vitamins and minerals. High-fiber foods include whole-grain cereals and breads, oatmeal, beans, brown rice, citrus fruits, and apples.  ?? Buy whole-grain breads and cereals instead of white bread  and pastries.  Limit saturated fat  ?? Read food labels and try to avoid saturated fat and trans fat. They increase your risk of heart disease.  ?? Use olive or canola oil when you cook. Try cholesterol-lowering spreads, such as Benecol or  Take Control.  ?? Bake, broil, grill, or steam foods instead of frying them.  ?? Limit the amount of high-fat meats you eat, including hot dogs and sausages. Cut out all visible fat when you prepare meat.  ?? Eat fish, skinless poultry, and soy products such as tofu instead of high-fat meats. Soybeans may be especially good for your heart. Eat at least two servings of fish a week. Certain fish, such as salmon, contain omega-3 fatty acids, which may help reduce your risk of heart attack.  ?? Choose low-fat or fat-free milk and dairy products.  Get exercise, limit alcohol, and quit smoking  ?? Get more exercise. Work with your doctor to set up an exercise program. Even if you can do only a small amount, exercise will help you get stronger, have more energy, and manage your weight and your stress. Walking is an easy way to get exercise. Gradually increase the amount you walk every day. Aim for at least 30 minutes on most days of the week. You also may want to swim, bike, or do other activities.  ?? Limit alcohol to no more than 2 drinks a day for men and 1 drink a day for women.  ?? Do not smoke. If you need help quitting, talk to your doctor about stop-smoking programs and medicines. These can increase your chances of quitting for good.  When should you call for help?  Call 911 anytime you think you may need emergency care. For example, call if:  ?? You have symptoms of a heart attack. These may include:  ?? Chest pain or pressure, or a strange feeling in the chest.  ?? Sweating.  ?? Shortness of breath.  ?? Nausea or vomiting.  ?? Pain, pressure, or a strange feeling in the back, neck, jaw, or upper belly or in one or both shoulders or arms.  ?? Lightheadedness or sudden weakness.  ?? A fast or  irregular heartbeat.  After you call 911, the operator may tell you to chew 1 adult-strength or 2 to 4 low-dose aspirin. Wait for an ambulance. Do not try to drive yourself.  ?? You have signs of a stroke. These may include:  ?? Sudden numbness, paralysis, or weakness in your face, arm, or leg, especially on only one side of your body.  ?? New problems with walking or balance.  ?? Sudden vision changes.  ?? Drooling or slurred speech.  ?? New problems speaking or understanding simple statements, or feeling confused.  ?? A sudden, severe headache that is different from past headaches.  ?? You passed out (lost consciousness).  Call your doctor now or seek immediate medical care if:  ?? You have muscle pain or weakness.  Watch closely for changes in your health, and be sure to contact your doctor if:  ?? You are very tired.  ?? You have an upset stomach, gas, constipation, or belly pain or cramps.   Where can you learn more?   Go to [email or web address]  Enter C406 in the search box to learn more about "Hyperlipidemia: After Your Visit."   ?? 2006-2013 [employer]. Care instructions adapted under license by [employer] (which disclaims liability or warranty for this information). This care instruction is for use with your licensed healthcare professional. If you have questions about a medical condition or this instruction, always ask your healthcare professional. [employer] disclaims any warranty or liability for your use of this information.  Content Version: 9.9.209917; Last Revised: October 20, 2009  High Blood Pressure: After Your Visit  Your Care Instructions  If your blood pressure is usually above 140/90, you have high blood pressure, or hypertension. Despite what a lot of people think, high blood pressure usually doesn't cause headaches or make you feel dizzy or lightheaded. It usually has no symptoms. But it does increase your risk for heart attack, stroke, and kidney  or eye damage. The higher your blood pressure, the more your risk increases.  Your doctor will give you a goal for your blood pressure. Your goal will be based on your health and your age. An example of a goal is to keep your blood pressure below 140/90.  Lifestyle changes, such as eating healthy and being active, are always important to help lower blood pressure. You might also take medicine to reach your blood pressure goal.  Follow-up care is a key part of your treatment and safety. Be sure to make and go to all appointments, and call your doctor if you are having problems. It's also a good idea to know your test results and keep a list of the medicines you take.  How can you care for yourself at home?  Medical treatment  ?? If you stop taking your medicine, your blood pressure will go back up. You may take one or more types of medicine to lower your blood pressure. Be safe with medicines. Take your medicine exactly as prescribed. Call your doctor if you think you are having a problem with your medicine.  ?? Your doctor may suggest that you take one low-dose aspirin (81 mg) a day. This can help reduce your risk of having a stroke or heart attack.  ?? See your doctor regularly. You may need to see the doctor more often at first or until your blood pressure comes down.  ?? If you are taking blood pressure medicine, talk to your doctor before you take decongestants or anti-inflammatory medicine, such as ibuprofen. Some of these medicines can raise blood pressure.  ?? Learn how to check your blood pressure at home.  Lifestyle changes  ?? Stay at a healthy weight. This is especially important if you put on weight around the waist. Losing even 10 pounds can help you lower your blood pressure.  ?? If your doctor recommends it, get more exercise. Walking is a good choice. Bit by bit, increase the amount you walk every day. Try for at least 30 minutes on most days of the week. You also may want to swim, bike, or do other  activities.  ?? Avoid or limit alcohol. Talk to your doctor about whether you can drink any alcohol.  ?? Try to limit how much sodium you eat to less than 2,300 milligrams (mg) a day. Your doctor may ask you to try to eat less than 1,500 mg a day.  ?? Eat plenty of fruits (such as bananas and oranges), vegetables, legumes, whole grains, and low-fat dairy products.  ?? Lower the amount of saturated fat in your diet. Saturated fat is found in animal products such as milk, cheese, and meat. Limiting these foods may help you lose weight and also lower your risk for heart disease.  ?? Do not smoke. Smoking increases your risk for heart attack and stroke. If you need help quitting, talk to your doctor about stop-smoking programs and medicines. These can increase your chances of quitting for good.  When should you call for help?  Call your doctor now or seek immediate medical care if:  ??  Your blood pressure is much higher than normal (such as 180/110 or higher).  ?? You think high blood pressure is causing symptoms such as:  ?? Severe headache.  ?? Blurry vision.  Watch closely for changes in your health, and be sure to contact your doctor if:  ?? You do not get better as expected.   Where can you learn more?   Go to [email or web address]  Enter [phone] in the search box to learn more about "High Blood Pressure: After Your Visit."   ?? 2006-2014 [employer]. Care instructions adapted under license by [employer] (which disclaims liability or warranty for this information). This care instruction is for use with your licensed healthcare professional. If you have questions about a medical condition or this instruction, always ask your healthcare professional. [employer] disclaims any warranty or liability for your use of this information.  Content Version: 10.1.311062; Current as of: March 19, 2012              Well Visit, Over 65: After Your Visit  Your Care Instructions  Physical exams can help  you stay healthy. Your doctor has checked your overall health and may have suggested ways to take good care of yourself. He or she also may have recommended tests. At home, you can help prevent illness with healthy eating, regular exercise, and other steps.  Follow-up care is a key part of your treatment and safety. Be sure to make and go to all appointments, and call your doctor if you are having problems. It's also a good idea to know your test results and keep a list of the medicines you take.  How can you care for yourself at home?  ?? Reach and stay at a healthy weight. This will lower your risk for many problems, such as obesity, diabetes, heart disease, and high blood pressure.  ?? Get at least 30 minutes of exercise on most days of the week. Walking is a good choice. You also may want to do other activities, such as running, swimming, cycling, or playing tennis or team sports.  ?? Do not smoke. Smoking can make health problems worse. If you need help quitting, talk to your doctor about stop-smoking programs and medicines. These can increase your chances of quitting for good.  ?? Always wear sunscreen on exposed skin. Make sure the sunscreen blocks ultraviolet rays (both UVA and UVB) and has a sun protection factor (SPF) of at least 15. Use it every day, even when it is cloudy. Some doctors may recommend a higher SPF, such as 30.  ?? See a dentist one or two times a year for checkups and to have your teeth cleaned.  ?? Wear a seat belt in the car.  ?? Limit alcohol to 2 drinks a day for men and 1 drink a day for women. Too much alcohol can cause health problems.  Follow your doctor's advice about when to have certain tests. These tests can spot problems early.  For men and women  ?? Cholesterol. Your doctor will tell you how often to have this done based on your overall health and other things that can increase your risk for heart attack and stroke.  ?? Blood pressure. You will likely have your blood pressure checked  at any visit to your doctor. If you are healthy and have a blood pressure below 120/80 mm Hg, have your blood pressure checked at least every 1 to 2 years. This can be done during a routine doctor visit.  If you have slightly higher or high blood pressure, or are at risk for heart disease, your doctor will suggest more frequent tests.  ?? Diabetes. Ask your doctor whether you should have tests for diabetes.  ?? Vision. Experts recommend that you have yearly exams for glaucoma and other age-related eye problems.  ?? Hearing. Tell your doctor if you notice any change in your hearing. You can have tests to find out how well you hear.  ?? Colon cancer tests. Keep having colon cancer tests as your doctor recommends. You can have one of several types of tests.  ?? Heart attack and stroke risk. At least every 4 to 6 years, you should have your risk for heart attack and stroke assessed. Your doctor uses factors such as your age, blood pressure, cholesterol, and whether you smoke or have diabetes to show what your risk for a heart attack or stroke is over the next 10 years.  ?? Osteoporosis. Talk to your doctor about whether you should have a bone density test to find out whether you have thinning bones. Also ask your doctor about whether you should take calcium and vitamin D supplements.  For women  ?? Pap test and pelvic exam. You may no longer need a Pap test. Talk with your doctor about whether to stop or continue to have Pap tests.  ?? Breast exam and mammogram. Ask how often you should have a mammogram, which is an X-ray of your breasts. A mammogram can spot breast cancer before it can be felt and when it is easiest to treat.  ?? Thyroid disease. Talk to your doctor about whether to have your thyroid checked as part of a regular physical exam. Women have an increased chance of a thyroid problem.  For men  ?? Prostate exam. Talk to your doctor about whether you should have a blood test (called a PSA test) for prostate cancer.  Experts disagree on whether men should have this test. Some experts recommend that you discuss the benefits and risks of the test with your doctor.  ?? Abdominal aortic aneurysm. Ask your doctor whether you should have a test to check for an aneurysm. You may need a test if you ever smoked or if your parent, brother, sister, or child has had an aneurysm.  When should you call for help?  Watch closely for changes in your health, and be sure to contact your doctor if you have any problems or symptoms that concern you.   Where can you learn more?   Go to [email or web address]  Enter [phone] in the search box to learn more about "Well Visit, Over 65: After Your Visit."   ?? 2006-2014 [employer]. Care instructions adapted under license by [employer] (which disclaims liability or warranty for this information). This care instruction is for use with your licensed healthcare professional. If you have questions about a medical condition or this instruction, always ask your healthcare professional. [employer] disclaims any warranty or liability for your use of this information.  Content Version: 10.1.311062; Current as of: March 19, 2012

## 2012-08-15 NOTE — Progress Notes (Signed)
This is an Initial Medicare Annual Wellness Exam (AWV) (Performed 12 months after IPPE or effective date of Medicare Part B enrollment, Once in a lifetime)    I have reviewed the patient's medical history in detail and updated the computerized patient record.     History   Present for CPE, last Complete Physical exam was  2012,  Up todate w/ all vaccination, last tetanus vaccine was in <43yrs ago  last mammog was nl and In 70yrs ago, last pap smear normal and was in 71yrs ago  .   last colonoscopy was normal and was 5 yrs Ago,   + past surgical hx,  last bone dexa scan was normal and was 6 Yrs ago,    No family hx of breast cancer   no family hx of colon cancer,father deceased of stomach ca nd mother of natural cause, not sexaully active and uses Safe sex, + physically active,  compliant w/ meds, no Rf needed for today for her meds.   A1c uncontrolled, triglyceride >400, tobacco abuser daily for many yrs, TSH low level, ,lab results discussed, refusing and insisting not to be on any diabetic nor chol medications for her medical condition at this time,     Past Medical History   Diagnosis Date   ??? Hypertension    ??? Thromboembolus    ??? HTN, goal below 130/80 07/30/2012   ??? Hyperlipidemia LDL goal < 100 07/30/2012   ??? Hypothyroidism 07/30/2012   ??? History of elevated glucose 07/30/2012   ??? Chronic back pain greater than 3 months duration 07/30/2012   ??? Vitamin D deficiency 07/30/2012      Past Surgical History   Procedure Laterality Date   ??? Hx thyroidectomy Bilateral 1994     Radioactively   ??? Hx back surgery Left 2012   ??? Hx carpal tunnel release  1984     right hand   ??? Hx cholecystectomy  1984   ??? Hx tubal ligation  03/29/1975   ??? Hx acl reconstruction  1978     left knee repair   ??? Hx hammer toe repair  1999     bilateral little toe     Current Outpatient Prescriptions   Medication Sig Dispense Refill   ??? lisinopril (PRINIVIL, ZESTRIL) 20 mg tablet Take 1 Tab by mouth daily.  90 Tab  6   ??? levothyroxine (SYNTHROID) 100  mcg tablet Take 1 Tab by mouth Daily (before breakfast).  90 Tab  3   ??? oxyCODONE-acetaminophen (PERCOCET) 5-325 mg per tablet Take 1 Tab by mouth every eight (8) hours as needed for Pain.  90 Tab  0   ??? gabapentin (NEURONTIN) 400 mg capsule Take 1 Cap by mouth three (3) times daily.  90 Cap  2     Allergies   Allergen Reactions   ??? Ceclor (Cefaclor) Anaphylaxis   ??? Sulfa (Sulfonamide Antibiotics) Anaphylaxis   ??? Nasonex (Mometasone) Rash     Family History   Problem Relation Age of Onset   ??? Diabetes Father      type 1   ??? Cancer Father      stomach   ??? Diabetes Sister      type 2   ??? Diabetes Brother      type 2   ??? Heart Disease Brother      History   Substance Use Topics   ??? Smoking status: Current Every Day Smoker -- 1.00 packs/day for 50 years   ??? Smokeless  tobacco: Never Used   ??? Alcohol Use: No     Patient Active Problem List   Diagnosis Code   ??? HTN, goal below 130/80 401.9   ??? Hyperlipidemia LDL goal < 100 272.4   ??? Hypothyroidism 244.9   ??? History of elevated glucose V12.29   ??? Chronic back pain greater than 3 months duration 724.5, 338.29   ??? Vitamin D deficiency 268.9         Depression Risk Factor Screening:     PHQ 2 / 9, over the last two weeks 07/30/2012   Little interest or pleasure in doing things Not at all   Feeling down, depressed or hopeless Not at all   Total Score PHQ 2 0     Alcohol Risk Factor Screening:   On any occasion during the past 3 months, have you had more than 3 drinks containing alcohol?  No    Do you average more than 7 drinks per week?  No    Functional Ability and Level of Safety:     Hearing Loss   nl    Activities of Daily Living   Self-care.   Requires no assistance with: ambulation, bathing and hygiene, feeding, continence, grooming, toileting and dressing    Fall Risk     Fall Risk Assessment, last 12 mths 07/30/2012   Able to walk? Yes   Fall in past 12 months? Yes   Fall with injury? No   Number of falls in past 12 months 1   Fall Risk Score 1     Abuse Screen   Patient  is not abused    Review of Systems   Constitutional: negative  Eyes: negative  Ears, nose, mouth, throat, and face: negative  Respiratory: negative  Cardiovascular: negative  Gastrointestinal: negative  Genitourinary:negative  Integument/breast: negative  Hematologic/lymphatic: negative  Musculoskeletal:++for the back pain unable to move freely  Neurological: negative  Behavioral/Psych: negative  Endocrine: negative  Allergic/Immunologic: negative    Physical Examination     No exam data present    Evaluation of Cognitive Function:  Mood/affect:  neutral  Appearance: age appropriate  Family member/caregiver input: none    BP 129/79   Pulse 86   Temp(Src) 97.3 ??F (36.3 ??C) (Oral)   Resp 20   Ht 5\' 2"  (1.575 m)   Wt 144 lb (65.318 kg)   BMI 26.33 kg/m2   SpO2 93%  General:  Alert, cooperative, no distress, appears stated age.   Head:  Normocephalic, without obvious abnormality, atraumatic.   Eyes:  Conjunctivae/corneas clear. PERRL, EOMs intact. Fundi benign.   Ears:  Normal TMs and external ear canals both ears.   Nose: Nares normal. Septum midline. Mucosa normal. No drainage or sinus tenderness.   Throat: Lips, mucosa, and tongue normal. Teeth and gums normal.   Neck: Supple, symmetrical, trachea midline, no adenopathy, thyroid: no enlargement/tenderness/nodules, no carotid bruit and no JVD.   Back:   Symmetric, no curvature. ROM normal. No CVA tenderness.   Lungs:   Clear to auscultation bilaterally.   Chest wall:  No tenderness or deformity.   Heart:  Regular rate and rhythm, S1, S2 normal, no murmur, click, rub or gallop.   Breast Exam:  Not done.   Abdomen:   Soft, non-tender. Bowel sounds normal. No masses,  No organomegaly.   Genitalia:  Not examined, pt will let us know about her pap which is due on the next visit.   Rectal:  Normal tone,  no masses  or tenderness  Guaiac negative stool.   Extremities: Extremities normal, atraumatic, no cyanosis or edema.   Pulses: 2+ and symmetric all extremities.   Skin:  Skin color, texture, turgor normal. No rashes or lesions.   Lymph nodes: Cervical, supraclavicular, and axillary nodes normal.   Neurologic: CNII-XII intact. Normal strength, sensation and reflexes throughout.       Patient Care Team:  Purnell Shoemaker, MD as PCP - General (Family Practice)    Advice/Referrals/Counseling   Education and counseling provided:  Are appropriate based on today's review and evaluation  End-of-Life planning (with patient's consent)  Pneumococcal Vaccine, as per pt already had it  Influenza Vaccine, done so as per pt  Screening Mammography, pending  Screening Pap and pelvic (covered once every 2 years), pending  Colorectal cancer screening tests  Bone mass measurement (DEXA), done tody  Screening for glaucomas, done today    Assessment/Plan   Katrina Rogers was seen today for annual wellness visit.    Diagnoses and associated orders for this visit:    Routine general medical examination at a health care facility  - MAM MAMMOGRAM SCREENING DIGITAL BILAT; Future  - Tobacco Use Counsel 3 - 10 min (Z6109)  - Referral to Ophthalmology  - Tobacco Use Counsel 3 - 10 min (U0454)  - Intense Behavioral Therapy for Cardiovascular Disease (U9811)  - Discontinue: varicella zoster vacine live (VARICELLA-ZOSTER VIRUS INFECTION PROPHYLAXIS) 19,400 unit susr injection; 1 Vial by SubCUTAneous route once for 1 dose.  - DEXA BONE DENSITY STUDY AXIAL; Future  - levothyroxine (SYNTHROID) 88 mcg tablet; Take 1 Tab by mouth Daily (before breakfast).  - metFORMIN (GLUCOPHAGE) 850 mg tablet; Take 1 Tab by mouth two (2) times daily (with meals).  - simvastatin (ZOCOR) 20 mg tablet; Take 1 Tab by mouth nightly.  - ergocalciferol (ERGOCALCIFEROL) 50,000 unit capsule; Take 1 Cap by mouth every seven (7) days.  - aspirin delayed-release 81 mg tablet; Take 1 Tab by mouth daily.    Screening for malignant neoplasm of breast  - MAM MAMMOGRAM SCREENING DIGITAL BILAT; Future    Screening for depression    Tobacco use disorder  - aspirin  delayed-release 81 mg tablet; Take 1 Tab by mouth daily.    Screening for alcoholism  - MAM MAMMOGRAM SCREENING DIGITAL BILAT; Future  - Tobacco Use Counsel 3 - 10 min (B1478)  - Tobacco Use Counsel 3 - 10 min (G9562)  - Intense Behavioral Therapy for Cardiovascular Disease (Z3086)  - Discontinue: varicella zoster vacine live (VARICELLA-ZOSTER VIRUS INFECTION PROPHYLAXIS) 19,400 unit susr injection; 1 Vial by SubCUTAneous route once for 1 dose.  - DEXA BONE DENSITY STUDY AXIAL; Future  - levothyroxine (SYNTHROID) 88 mcg tablet; Take 1 Tab by mouth Daily (before breakfast).  - metFORMIN (GLUCOPHAGE) 850 mg tablet; Take 1 Tab by mouth two (2) times daily (with meals).  - simvastatin (ZOCOR) 20 mg tablet; Take 1 Tab by mouth nightly.  - ergocalciferol (ERGOCALCIFEROL) 50,000 unit capsule; Take 1 Cap by mouth every seven (7) days.  - aspirin delayed-release 81 mg tablet; Take 1 Tab by mouth daily.    Screening for glaucoma  - Referral to Ophthalmology    Abnormal bone density screening  - DEXA BONE DENSITY STUDY AXIAL; Future    Elevated WBC count  - levothyroxine (SYNTHROID) 88 mcg tablet; Take 1 Tab by mouth Daily (before breakfast).  - aspirin delayed-release 81 mg tablet; Take 1 Tab by mouth daily.    Diabetes mellitus type 2, uncontrolled  -  Referral to Ophthalmology  - levothyroxine (SYNTHROID) 88 mcg tablet; Take 1 Tab by mouth Daily (before breakfast).  - metFORMIN (GLUCOPHAGE) 850 mg tablet; Take 1 Tab by mouth two (2) times daily (with meals).  - simvastatin (ZOCOR) 20 mg tablet; Take 1 Tab by mouth nightly.  - aspirin delayed-release 81 mg tablet; Take 1 Tab by mouth daily.    Need for varicella vaccine  - varicella zoster vacine live (ZOSTAVAX) 19,400 unit susr injection; 1 Vial by SubCUTAneous route once for 1 dose.  - Discontinue: varicella zoster vacine live (VARICELLA-ZOSTER VIRUS INFECTION PROPHYLAXIS) 19,400 unit susr injection; 1 Vial by SubCUTAneous route once for 1 dose.    HTN, goal below 130/80  -  metFORMIN (GLUCOPHAGE) 850 mg tablet; Take 1 Tab by mouth two (2) times daily (with meals).  - simvastatin (ZOCOR) 20 mg tablet; Take 1 Tab by mouth nightly.  - aspirin delayed-release 81 mg tablet; Take 1 Tab by mouth daily.    Hyperlipidemia LDL goal < 100  - metFORMIN (GLUCOPHAGE) 850 mg tablet; Take 1 Tab by mouth two (2) times daily (with meals).  - simvastatin (ZOCOR) 20 mg tablet; Take 1 Tab by mouth nightly.  - aspirin delayed-release 81 mg tablet; Take 1 Tab by mouth daily.    Vitamin D deficiency  - ergocalciferol (ERGOCALCIFEROL) 50,000 unit capsule; Take 1 Cap by mouth every seven (7) days.    Hypothyroidism  - levothyroxine (SYNTHROID) 88 mcg tablet; Take 1 Tab by mouth Daily (before breakfast).    Other Orders  - Cancel: PNEUMOCOCCAL POLYSACCHARIDE VACCINE, 23-VALENT, ADULT OR IMMUNOSUPPRESSED PT DOSE,      compliancy advised for current medications, otherwise will not be able to provide further care for her at this time, may have to refer her to other MD's for further care, Anticipatory Guidance: Nutrition, safety, smoking, alcohol, drugs, puberty,   sexual education, exercise, preconditioning for  sports activities.  stay at a healthy weight, at least 30 minutes of physical activity on most days of the week. Do not smoke and Drink alcohol or allow others to smoke around you.  risk factors for sexually transmitted infections (STIs) addressed.   Always wear sunscreen on exposed skin.See a dentist one or two times a year for checkups and to have your teeth cleaned.  Wear a seat belt in the car.    in moderation     lab results and schedule of future lab studies reviewed with patient  reviewed diet, exercise and weight control  very strongly urged to quit smoking to reduce cardiovascular risk  cardiovascular risk and specific lipid/LDL goals reviewed  reviewed medications and side effects in detail  use of aspirin to prevent MI and TIA's discussed  radiology results and schedule of future radiology  studies reviewed with patient.

## 2012-08-15 NOTE — Progress Notes (Signed)
This is an Initial Medicare Annual Wellness Exam (AWV) (Performed 12 months after IPPE or effective date of Medicare Part B enrollment, Once in a lifetime)    I have reviewed the patient's medical history in detail and updated the computerized patient record.     History     Past Medical History   Diagnosis Date   ??? Hypertension    ??? Thromboembolus    ??? HTN, goal below 130/80 07/30/2012   ??? Hyperlipidemia LDL goal < 100 07/30/2012   ??? Hypothyroidism 07/30/2012   ??? History of elevated glucose 07/30/2012   ??? Chronic back pain greater than 3 months duration 07/30/2012   ??? Vitamin D deficiency 07/30/2012      Past Surgical History   Procedure Laterality Date   ??? Hx thyroidectomy Bilateral 1994     Radioactively   ??? Hx back surgery Left 2012   ??? Hx carpal tunnel release  1984     right hand   ??? Hx cholecystectomy  1984   ??? Hx tubal ligation  03/29/1975   ??? Hx acl reconstruction  1978     left knee repair   ??? Hx hammer toe repair  1999     bilateral little toe     Current Outpatient Prescriptions   Medication Sig Dispense Refill   ??? lisinopril (PRINIVIL, ZESTRIL) 20 mg tablet Take 1 Tab by mouth daily.  90 Tab  6   ??? levothyroxine (SYNTHROID) 100 mcg tablet Take 1 Tab by mouth Daily (before breakfast).  90 Tab  3   ??? oxyCODONE-acetaminophen (PERCOCET) 5-325 mg per tablet Take 1 Tab by mouth every eight (8) hours as needed for Pain.  90 Tab  0   ??? gabapentin (NEURONTIN) 400 mg capsule Take 1 Cap by mouth three (3) times daily.  90 Cap  2     Allergies   Allergen Reactions   ??? Ceclor (Cefaclor) Anaphylaxis   ??? Sulfa (Sulfonamide Antibiotics) Anaphylaxis   ??? Nasonex (Mometasone) Rash     Family History   Problem Relation Age of Onset   ??? Diabetes Father      type 1   ??? Cancer Father      stomach   ??? Diabetes Sister      type 2   ??? Diabetes Brother      type 2   ??? Heart Disease Brother      History   Substance Use Topics   ??? Smoking status: Current Every Day Smoker -- 1.00 packs/day for 50 years   ??? Smokeless tobacco: Never Used    ??? Alcohol Use: No     Patient Active Problem List   Diagnosis Code   ??? HTN, goal below 130/80 401.9   ??? Hyperlipidemia LDL goal < 100 272.4   ??? Hypothyroidism 244.9   ??? History of elevated glucose V12.29   ??? Chronic back pain greater than 3 months duration 724.5, 338.29   ??? Vitamin D deficiency 268.9         Depression Risk Factor Screening:     PHQ 2 / 9, over the last two weeks 07/30/2012   Little interest or pleasure in doing things Not at all   Feeling down, depressed or hopeless Not at all   Total Score PHQ 2 0     Alcohol Risk Factor Screening:   On any occasion during the past 3 months, have you had more than 3 drinks containing alcohol?  No    Do  you average more than 7 drinks per week?  No    Functional Ability and Level of Safety:     Hearing Loss   mild    Activities of Daily Living   Self-care.   Requires assistance with: no ADLs    Fall Risk     Fall Risk Assessment, last 12 mths 07/30/2012   Able to walk? Yes   Fall in past 12 months? Yes   Fall with injury? No   Number of falls in past 12 months 1   Fall Risk Score 1     Abuse Screen   Patient is not abused    Review of Systems   Not required    Physical Examination     No exam data present    Evaluation of Cognitive Function:  Mood/affect:  happy  Appearance: age appropriate  Family member/caregiver input: none present    No exam performed today, Medicare Wellness Visit.    Patient Care Team:  Purnell Shoemaker, MD as PCP - General [facility])    Advice/Referrals/Counseling   Education and counseling provided:  End-of-Life planning (with patient's consent)  Pneumococcal Vaccine  Screening Mammography  Colorectal cancer screening tests  Smoking counsel    Assessment/Plan   Patient to follow up with PCP as directed and in 1 year for wellness exam.  Will obtain consent to get medical records from her PCP in [place].  Patient stated she received her PNA, and flu in 2013 at PCP in [place]. Patient declines zoster vaccine due to costing to much

## 2012-08-15 NOTE — Progress Notes (Signed)
Patient was seen today for the wellness exam and the abnormal lab results, she does not want to be complaint with the today medical advises given to her for her medical conditions: she did not want to have her Thyroid medication to be changed at all, stating that it will be the wrong things to do after explaining to her the result of the TSH level being on the low side, in addition, she did not wanted to be on diabetic medication, stating that she is the borderline and she is not in the diabetic state, she also insisted not to be on any cholesterol medication with triglyceride of >400, in addition of being a dail tobacco abuser, sedentary life style, at the end of her visit for wellness exam after her abnrmal lab results reviewed,  patient wanted to get a handicapped form for [facility] gets completed without the fee and the time and slot scheduled for her DMV form evaluation and the form was not completed, in addition, she did not mention anything regarding the DMV form to be completed at the front desk at all.  She was told that it has to be done on the different visit, patients got angry and upset stating that the physician is not listening to her at all and started to complaint about the physician to the office and the other nursing staff as well.  Since this occurrence, I will not be able to provide care for th patient at this time, because of this conflict, will send a letter of termination to her, so that she will have time to seek other MD's for her care.

## 2012-08-22 LAB — AMB POC FECAL BLOOD, OCCULT, QL 1 CARD: Hemoccult (POC): NEGATIVE

## 2012-08-22 NOTE — Addendum Note (Signed)
Addended by: Theodore Demark on: 08/22/2012 11:06 AM     Modules accepted: Orders

## 2013-08-24 MED ORDER — ERGOCALCIFEROL (VITAMIN D2) 50,000 UNIT CAP
1250 mcg (50,000 unit) | ORAL_CAPSULE | ORAL | Status: DC
Start: 2013-08-24 — End: 2019-10-15

## 2013-12-10 ENCOUNTER — Encounter

## 2013-12-15 ENCOUNTER — Inpatient Hospital Stay: Admit: 2013-12-15 | Payer: MEDICARE | Primary: Family Medicine

## 2013-12-15 DIAGNOSIS — M5126 Other intervertebral disc displacement, lumbar region: Secondary | ICD-10-CM

## 2019-07-22 ENCOUNTER — Encounter

## 2019-07-30 ENCOUNTER — Encounter

## 2019-08-10 ENCOUNTER — Inpatient Hospital Stay: Admit: 2019-08-10 | Payer: MEDICARE | Attending: Family Medicine | Primary: Family Medicine

## 2019-08-10 DIAGNOSIS — I6529 Occlusion and stenosis of unspecified carotid artery: Secondary | ICD-10-CM

## 2019-08-10 LAB — VAS DUP CAROTID BILATERAL
Left CCA dist EDV: 23.7 cm/s
Left CCA dist PSV: 72.1 cm/s
Left CCA prox EDV: 25.2 cm/s
Left CCA prox PSV: 97.9 cm/s
Left ECA EDV: 14.95 cm/s
Left ECA PSV: 65.5 cm/s
Left ICA dist EDV: 29.3 cm/s
Left ICA dist PSV: 97.8 cm/s
Left ICA mid EDV: 42.6 cm/s
Left ICA mid PSV: 151 cm/s
Left ICA prox EDV: 54.4 cm/s
Left ICA prox PSV: 156.9 cm/s
Left ICA/CCA PSV: 2.18
Left subclavian EDV: 0 cm/s
Left subclavian PSV: 131.8 cm/s
Left vertebral EDV: 13.61 cm/s
Left vertebral PSV: 57.2 cm/s
Right CCA dist EDV: 20.8 cm/s
Right CCA prox EDV: 20.8 cm/s
Right CCA prox PSV: 82.2 cm/s
Right ECA EDV: 13.72 cm/s
Right ECA PSV: 96.5 cm/s
Right ICA dist EDV: 38.3 cm/s
Right ICA dist PSV: 91.4 cm/s
Right ICA mid EDV: 26.3 cm/s
Right ICA mid PSV: 69.1 cm/s
Right ICA prox EDV: 19.7 cm/s
Right ICA prox PSV: 59.2 cm/s
Right ICA/CCA PSV: 1.5
Right cca dist PSV: 62.8 cm/s
Right subclavian EDV: 0 cm/s
Right subclavian PSV: 117.7 cm/s
Right vertebral EDV: 11.81 cm/s
Right vertebral PSV: 46 cm/s

## 2019-08-10 LAB — DUPLEX CAROTID BILATERAL
LEFT EXTERNAL CAROTID ARTERY D: 14.95 cm/s
LEFT SUBCLAVIAN ARTERY D: 0 cm/s
LEFT VERTEBRAL ARTERY D: 13.61 cm/s
Left CCA dist dias: 23.7 cm/s
Left CCA dist sys: 72.1 cm/s
Left CCA prox dias: 25.2 cm/s
Left CCA prox sys: 97.9 cm/s
Left ECA sys: 65.5 cm/s
Left ICA dist dias: 29.3 cm/s
Left ICA dist sys: 97.8 cm/s
Left ICA mid dias: 42.6 cm/s
Left ICA mid sys: 151 cm/s
Left ICA prox dias: 54.4 cm/s
Left ICA prox sys: 156.9 cm/s
Left ICA/CCA sys: 2.18
Left subclavian sys: 131.8 cm/s
Left vertebral sys: 57.2 cm/s
RIGHT EXTERNAL CAROTID ARTERY D: 13.72 cm/s
RIGHT SUBCLAVIAN ARTERY D: 0 cm/s
RIGHT VERTEBRAL ARTERY D: 11.81 cm/s
Right CCA dist dias: 20.8 cm/s
Right CCA prox dias: 20.8 cm/s
Right CCA prox sys: 82.2 cm/s
Right ICA dist dias: 38.3 cm/s
Right ICA dist sys: 91.4 cm/s
Right ICA mid dias: 26.3 cm/s
Right ICA mid sys: 69.1 cm/s
Right ICA prox dias: 19.7 cm/s
Right ICA prox sys: 59.2 cm/s
Right ICA/CCA sys: 1.5
Right cca dist sys: 62.8 cm/s
Right eca sys: 96.5 cm/s
Right subclavian sys: 117.7 cm/s
Right vertebral sys: 46 cm/s

## 2019-10-12 ENCOUNTER — Inpatient Hospital Stay: Admit: 2019-10-11 | Payer: MEDICARE | Primary: Family Medicine

## 2019-10-12 DIAGNOSIS — Z01812 Encounter for preprocedural laboratory examination: Secondary | ICD-10-CM

## 2019-10-13 LAB — COVID-19: SARS-CoV-2: NOT DETECTED

## 2019-10-13 LAB — SARS-COV-2: SARS-CoV-2: NOT DETECTED

## 2019-10-16 ENCOUNTER — Inpatient Hospital Stay: Payer: MEDICARE

## 2019-10-16 MED ORDER — NALOXONE 0.4 MG/ML INJECTION
0.4 mg/mL | INTRAMUSCULAR | Status: DC | PRN
Start: 2019-10-16 — End: 2019-10-16

## 2019-10-16 MED ORDER — SODIUM CHLORIDE 0.9 % IV
INTRAVENOUS | Status: DC
Start: 2019-10-16 — End: 2019-10-16
  Administered 2019-10-16: 15:00:00 via INTRAVENOUS

## 2019-10-16 MED ORDER — ATROPINE 0.1 MG/ML SYRINGE
0.1 mg/mL | Freq: Once | INTRAMUSCULAR | Status: DC | PRN
Start: 2019-10-16 — End: 2019-10-16

## 2019-10-16 MED ORDER — PROPOFOL 10 MG/ML IV EMUL
10 mg/mL | INTRAVENOUS | Status: DC | PRN
Start: 2019-10-16 — End: 2019-10-16
  Administered 2019-10-16 (×6): via INTRAVENOUS

## 2019-10-16 MED ORDER — FLUMAZENIL 0.1 MG/ML IV SOLN
0.1 mg/mL | INTRAVENOUS | Status: DC | PRN
Start: 2019-10-16 — End: 2019-10-16

## 2019-10-16 MED ORDER — SODIUM CHLORIDE 0.9 % IJ SYRG
INTRAMUSCULAR | Status: DC | PRN
Start: 2019-10-16 — End: 2019-10-16

## 2019-10-16 MED ORDER — SODIUM CHLORIDE 0.9 % IJ SYRG
Freq: Three times a day (TID) | INTRAMUSCULAR | Status: DC
Start: 2019-10-16 — End: 2019-10-16

## 2019-10-16 MED ORDER — EPINEPHRINE 0.1 MG/ML SYRINGE
0.1 mg/mL | Freq: Once | INTRAMUSCULAR | Status: DC | PRN
Start: 2019-10-16 — End: 2019-10-16

## 2019-10-16 MED ORDER — SIMETHICONE 40 MG/0.6 ML ORAL DROPS, SUSP
40 mg/0.6 mL | ORAL | Status: DC | PRN
Start: 2019-10-16 — End: 2019-10-16

## 2019-10-16 MED ORDER — MIDAZOLAM 1 MG/ML IJ SOLN
1 mg/mL | INTRAMUSCULAR | Status: DC | PRN
Start: 2019-10-16 — End: 2019-10-16

## 2019-10-16 MED FILL — SODIUM CHLORIDE 0.9 % IV: INTRAVENOUS | Qty: 1000

## 2019-10-16 MED FILL — BD POSIFLUSH NORMAL SALINE 0.9 % INJECTION SYRINGE: INTRAMUSCULAR | Qty: 40

## 2019-10-16 NOTE — Anesthesia Post-Procedure Evaluation (Signed)
Procedure(s):  COLONOSCOPY  ENDOSCOPIC POLYPECTOMY  RESOLUTION CLIP.    MAC    Anesthesia Post Evaluation      Multimodal analgesia: multimodal analgesia used between 6 hours prior to anesthesia start to PACU discharge  Patient location during evaluation: PACU  Level of consciousness: sleepy but conscious  Pain management: adequate  Airway patency: patent  Anesthetic complications: no  Cardiovascular status: acceptable  Respiratory status: acceptable  Hydration status: acceptable  Comments: +Post-Anesthesia Evaluation and Assessment    Patient: Katrina Rogers MRN: [id number]  SSN: [id number]   Date of Birth: 08/07/1945  Age: 74 y.o.  Sex: female      Cardiovascular Function/Vital Signs    BP (!) 169/69    Pulse 74    Temp 36.7 ??C (98 ??F)    Resp 17    SpO2 96%     Patient is status post Procedure(s):  COLONOSCOPY  ENDOSCOPIC POLYPECTOMY  RESOLUTION CLIP.    Nausea/Vomiting: Controlled.    Postoperative hydration reviewed and adequate.    Pain:  Pain Scale 1: Numeric (0 - 10) (10/16/19 1218)  Pain Intensity 1: 0 (10/16/19 1218)   Managed.    Neurological Status:       At baseline.    Mental Status and Level of Consciousness: Arousable.    Pulmonary Status:   O2 Device: None (Room air) (10/16/19 1218)   Adequate oxygenation and airway patent.    Complications related to anesthesia: None    Post-anesthesia assessment completed. No concerns.    Signed By: Joyice Faster, MD    10/16/2019  Post anesthesia nausea and vomiting:  controlled  Final Post Anesthesia Temperature Assessment:  Normothermia (36.0-37.5 degrees C)      INITIAL Post-op Vital signs:   Vitals Value Taken Time   BP 173/68 10/16/19 1221   Temp 36.7 ??C (98 ??F) 10/16/19 1205   Pulse 78 10/16/19 1223   Resp 19 10/16/19 1223   SpO2 95 % 10/16/19 1223   Vitals shown include unvalidated device data.

## 2019-10-16 NOTE — Anesthesia Pre-Procedure Evaluation (Signed)
Relevant Problems   CARDIOVASCULAR   (+) HTN, goal below 130/80      ENDOCRINE   (+) Hypothyroidism       Anesthetic History   No history of anesthetic complications            Review of Systems / Medical History  Patient summary reviewed, nursing notes reviewed and pertinent labs reviewed    Pulmonary            Asthma        Neuro/Psych   Within defined limits           Cardiovascular    Hypertension          Hyperlipidemia    Exercise tolerance: <4 METS:  Uses cane     GI/Hepatic/Renal           PUD     Endo/Other    Diabetes: type 2  Hypothyroidism       Other Findings   Comments: Hx of DVT           Physical Exam    Airway  Mallampati: II  TM Distance: 4 - 6 cm  Neck ROM: normal range of motion   Mouth opening: Normal     Cardiovascular  Regular rate and rhythm,  S1 and S2 normal,  no murmur, click, rub, or gallop  Rhythm: regular  Rate: normal         Dental    Dentition: Full upper dentures     Pulmonary  Breath sounds clear to auscultation               Abdominal  GI exam deferred       Other Findings            Anesthetic Plan    ASA: 2  Anesthesia type: MAC          Induction: Intravenous  Anesthetic plan and risks discussed with: Patient

## 2019-10-16 NOTE — Progress Notes (Signed)
Endoscope was pre-cleaned at the bedside immediately following procedure by Sosa

## 2019-10-16 NOTE — H&P (Signed)
Pre-endoscopy H and P    The patient was seen and examined in the room/pre-op holding area.  The airway was assessed and documented.  The problem list, past medical history, and medications were reviewed.     Patient Active Problem List   Diagnosis Code   ??? HTN, goal below 130/80 I10   ??? Hyperlipidemia LDL goal < 100 E78.5   ??? Hypothyroidism E03.9   ??? History of elevated glucose Z86.39   ??? Chronic back pain greater than 3 months duration M54.9, G89.29   ??? Vitamin D deficiency E55.9   ??? Screening for glaucoma Z13.5   ??? Abnormal bone density screening R93.7   ??? Elevated WBC count D72.829   ??? Diabetes mellitus type 2, uncontrolled (HCC) E11.65   ??? Need for varicella vaccine Z23     Social History     Socioeconomic History   ??? Marital status: MARRIED     Spouse name: Not on file   ??? Number of children: Not on file   ??? Years of education: Not on file   ??? Highest education level: Not on file   Occupational History   ??? Not on file   Tobacco Use   ??? Smoking status: Current Every Day Smoker     Packs/day: 1.00     Years: 50.00     Pack years: 50.00   ??? Smokeless tobacco: Never Used   Vaping Use   ??? Vaping Use: Never used   Substance and Sexual Activity   ??? Alcohol use: No   ??? Drug use: No   ??? Sexual activity: Never   Other Topics Concern   ??? Not on file   Social History Narrative   ??? Not on file     Social Determinants of Health     Financial Resource Strain:    ??? Difficulty of Paying Living Expenses:    Food Insecurity:    ??? Worried About Programme researcher, broadcasting/film/video in the Last Year:    ??? Barista in the Last Year:    Transportation Needs:    ??? Freight forwarder (Medical):    ??? Lack of Transportation (Non-Medical):    Physical Activity:    ??? Days of Exercise per Week:    ??? Minutes of Exercise per Session:    Stress:    ??? Feeling of Stress :    Social Connections:    ??? Frequency of Communication with Friends and Family:    ??? Frequency of Social Gatherings with Friends and Family:    ??? Attends Religious Services:    ???  Database administrator or Organizations:    ??? Attends Engineer, structural:    ??? Marital Status:    Intimate Programme researcher, broadcasting/film/video Violence:    ??? Fear of Current or Ex-Partner:    ??? Emotionally Abused:    ??? Physically Abused:    ??? Sexually Abused:      Past Medical History:   Diagnosis Date   ??? Abnormal bone density screening 08/15/2012   ??? Asthma    ??? Chronic back pain greater than 3 months duration 07/30/2012   ??? Diabetes mellitus type 2, uncontrolled (HCC) 08/15/2012   ??? Diverticulitis     since age 25yrs   ??? Elevated WBC count 08/15/2012   ??? History of elevated glucose 07/30/2012   ??? HTN, goal below 130/80 07/30/2012   ??? Hyperlipidemia LDL goal < 100 07/30/2012   ??? Hypertension    ???  Hypothyroidism 07/30/2012   ??? Need for varicella vaccine 08/15/2012   ??? PUD (peptic ulcer disease)    ??? Screening for glaucoma 08/15/2012   ??? [facility]romboemb[facility]us [facility])     patient denies   ??? Vitamin D deficiency 07/30/2012         Prior to Admission Medications   Prescriptions Last Dose Informant Patient Reported? Taking?   HYDROcodone-acetaminophen (NORCO) 7.5-325 mg per tablet   Yes Yes   Sig: Take 1 Tablet by mouth every six (6) hours as needed for Pain.   albuterol (PROVENTIL HFA, VENTOLIN HFA, PROAIR HFA) 90 mcg/actuation inhaler   Yes Yes   Sig: Take 1 Puff by inhalation every six (6) hours as needed for Wheezing.   aspirin delayed-release 81 mg tablet Not Taking at Unknown time  No No   Sig: Take 1 Tab by mouth daily.   Patient not taking: Reported on 10/15/2019   gabapentin (NEURONTIN) 400 mg capsule 10/16/2019 at Unknown time  No Yes   Sig: Take 1 Cap by mouth three (3) times daily.   levothyroxine (SYNTHROID) 88 mcg tablet 10/16/2019 at Unknown time  No Yes   Sig: Take 1 Tab by mouth Daily (before breakfast).   lisinopril (PRINIVIL, ZESTRIL) 20 mg tablet 10/16/2019  No Yes   Sig: Take 1 Tab by mouth daily.   metFORMIN (GLUCOPHAGE) 850 mg tablet 10/14/2019  No No   Sig: Take 1 Tab by mouth two (2) times daily (with meals).   nabumetone (RELAFEN) 500 mg  tablet   Yes Yes   Sig: Take 500 mg by mouth daily.   pravastatin (PRAVACHOL) 20 mg tablet 10/15/2019  Yes Yes   Sig: Take 20 mg by mouth nightly.      Facility-Administered Medications: None           The review of systems is:  Negative  for shortness of breath or chest pain      The heart, lungs, and mental status were satisfactory for the administration of deep sedation and for the procedure.      I discussed with the patient the objectives, risks, consequences and alternatives to the procedure.      Wyn Quaker, MD  10/16/2019  11:25 AM

## 2019-10-16 NOTE — Procedures (Signed)
Colonoscopy Procedure Note    Katrina Rogers  1945/02/06  [id number]    Indications:  Please see below.    Pre-operative Diagnosis: Encounter for screening colonoscopy [Z12.11]    Post-operative Diagnosis: Colonic diverticulosis, cecum AVM, Colon polyps, internal hemorrhoids      Operator: Mubashir A. Manuella Ghazi, MD    Referring Provider: Normand Sloop, MD    Sedation:  MAC anesthesia Propofol        Procedure Details:    After detailed informed consent was obtained with all risks and benefits of procedure explained and preoperative exam completed, the patient was taken to the endoscopy suite and placed in the left lateral decubitus position.  Upon sequential sedation as per above, a digital rectal exam was performed  and was normal.  The Olympus videocolonoscope  was inserted in the rectum and carefully advanced to the cecum, which was identified by the ileocecal valve and appendiceal orifice.  The quality of preparation was good.  The colonoscope was slowly withdrawn with careful evaluation between folds. Retroflexion in the rectum was performed.    Findings:   ?? The Olympus video high-definition colonoscope was advanced to the cecum, identified by its typical land marks, with ease.  ?? A single 7 mm cecal polyp removed with a cold snare. Site closed with a single BS Resolution clip  (mild oozing noted).  A small cecal non bleeding AVM was noted  ?? Pan-colonic diverticulosis is noted  ?? Two rectosigmoid colon sessile polyps are noted and both were removed with a cold snare ( 1 cm, 1.8 cm),  ?? Medium to large internal hemorrhoids          Therapies:  3 complete polypectomies were performed using cold snare  and the polyps were  retrieved, 1 clip applied as above    Specimen/s:   Specimens were collected as described above and sent to pathology.       Complications: None were encountered during the procedure.     EBL:  None.    Recommendations:     -Await pathology.  -Repeat colonoscopy in 2-3  years ,pending  biopsies.  Naturally, for new bleeding, unexplained weight loss,change in bowel habits and anemia, an earlier colonoscopy should be considered.      Mubashir A. Manuella Ghazi, MD  10/16/2019  11:53 AM

## 2020-05-10 IMAGING — CR FOOT RT 3 VWS MIN
1 series · 3 of 3 positions shown · non-contrast
Comparison: None available.

HISTORY/INDICATIONS:   Right foot pain.
TECHNIQUE: Right foot, 3 views.

[Series 1: lat · 0.17mm/px · 3 of 3 slices shown]
[im 1/3]
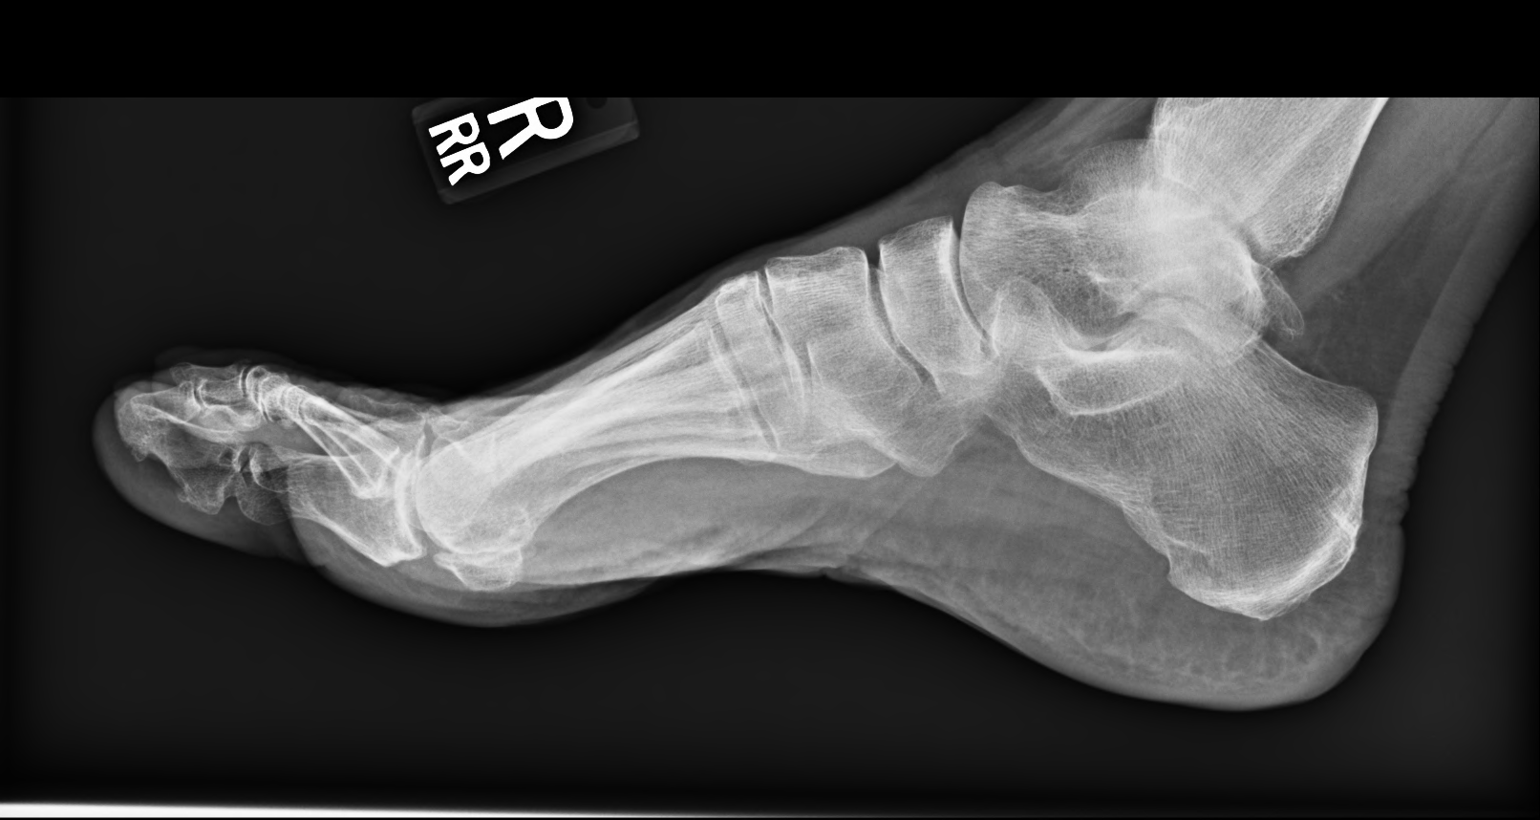
[im 2/3]
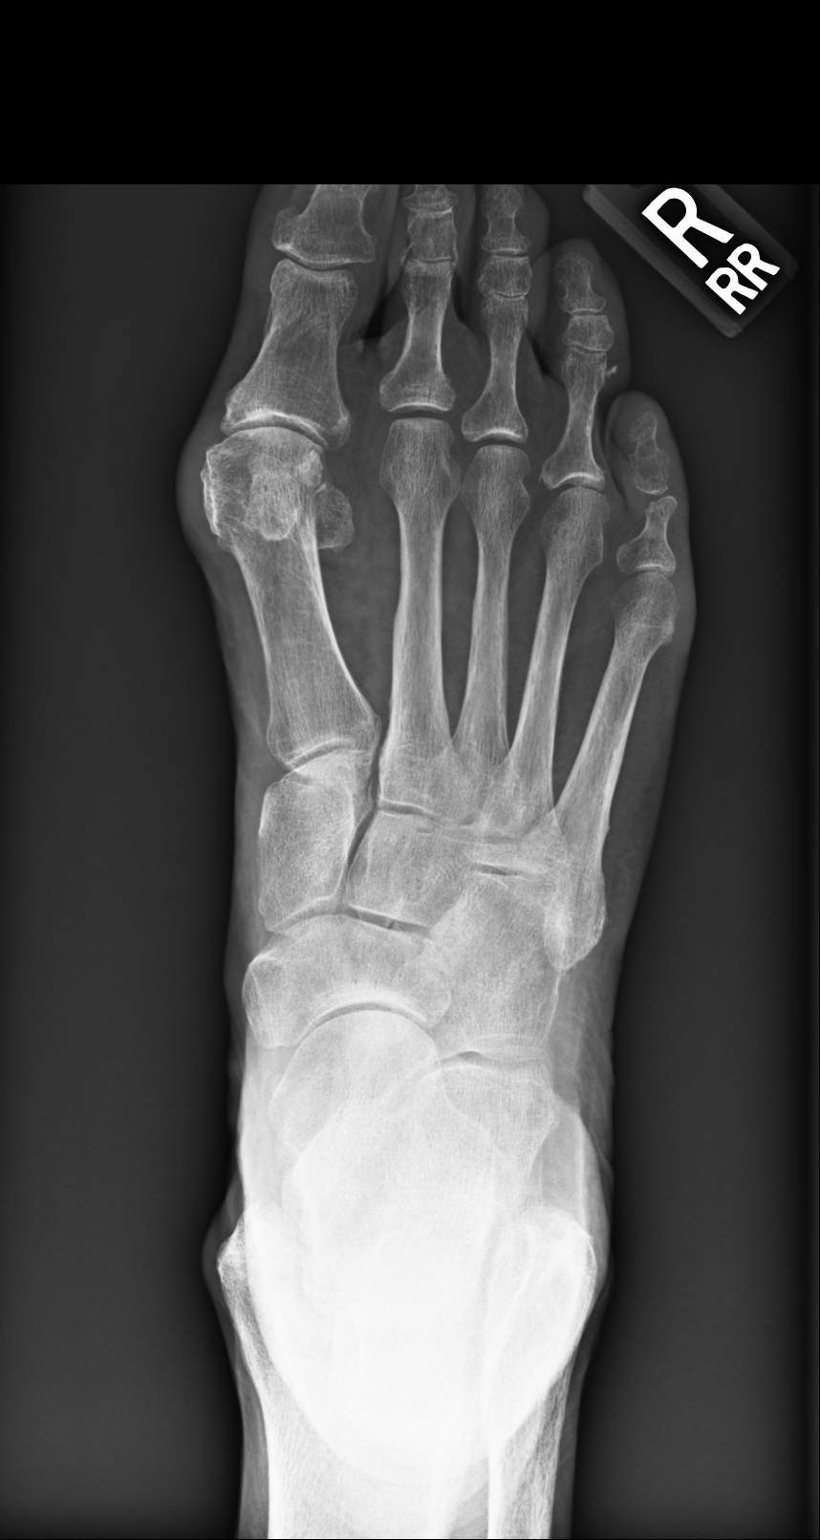
[im 3/3]
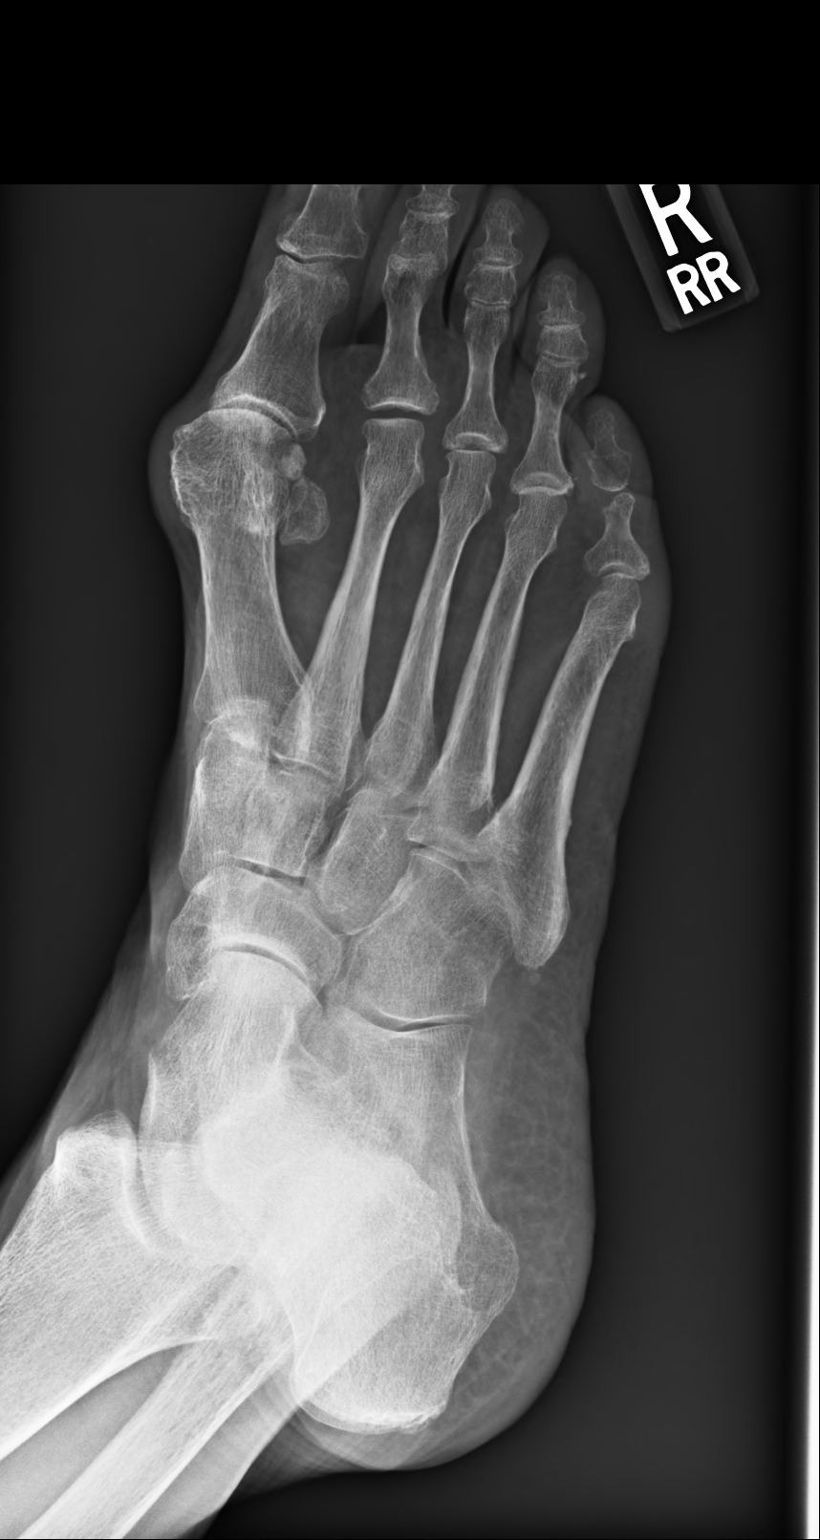

[3 of 3 positions shown; findings below may reference images not displayed]

FINDINGS: No acute fracture or dislocation. Normal mineralization. No erosions. Postsurgical changes of the proximal phalanx of the fifth toe. Moderate first MTP joint osteoarthritis and moderate hallux valgus. Scattered osteoarthritis of the interphalangeal joints of the toes. No radiographic evidence of inflammatory arthropathy. The overlying soft tissues are unremarkable. The alignment of the midfoot is maintained.
IMPRESSION: 1.
No radiographic evidence of inflammatory arthropathy.

2.
Moderate first MTP joint osteoarthritis and moderate hallux valgus.

3.
Scattered osteoarthritis of the interphalangeal joints of the toes.

## 2020-05-10 IMAGING — CR FOOT LT 3 VWS MIN
1 series · 3 of 3 positions shown · non-contrast
Comparison: None available.

HISTORY/INDICATIONS:  Left foot pain.
TECHNIQUE: 3 views of the left foot are obtained.

[Series 1: lat · 0.17mm/px · 3 of 3 slices shown]
[im 1/3]
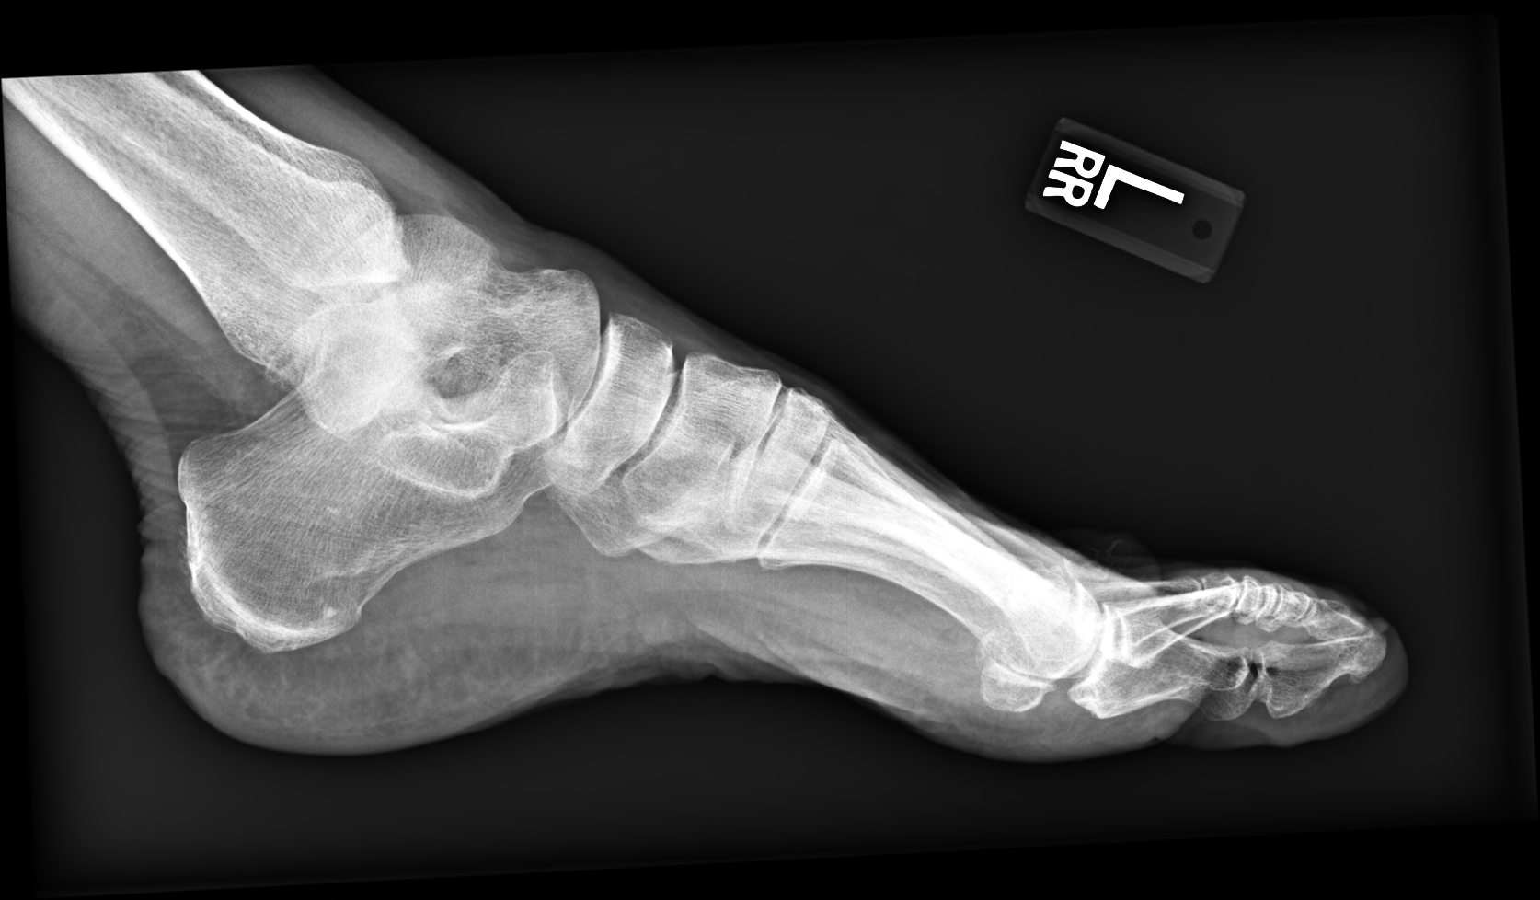
[im 2/3]
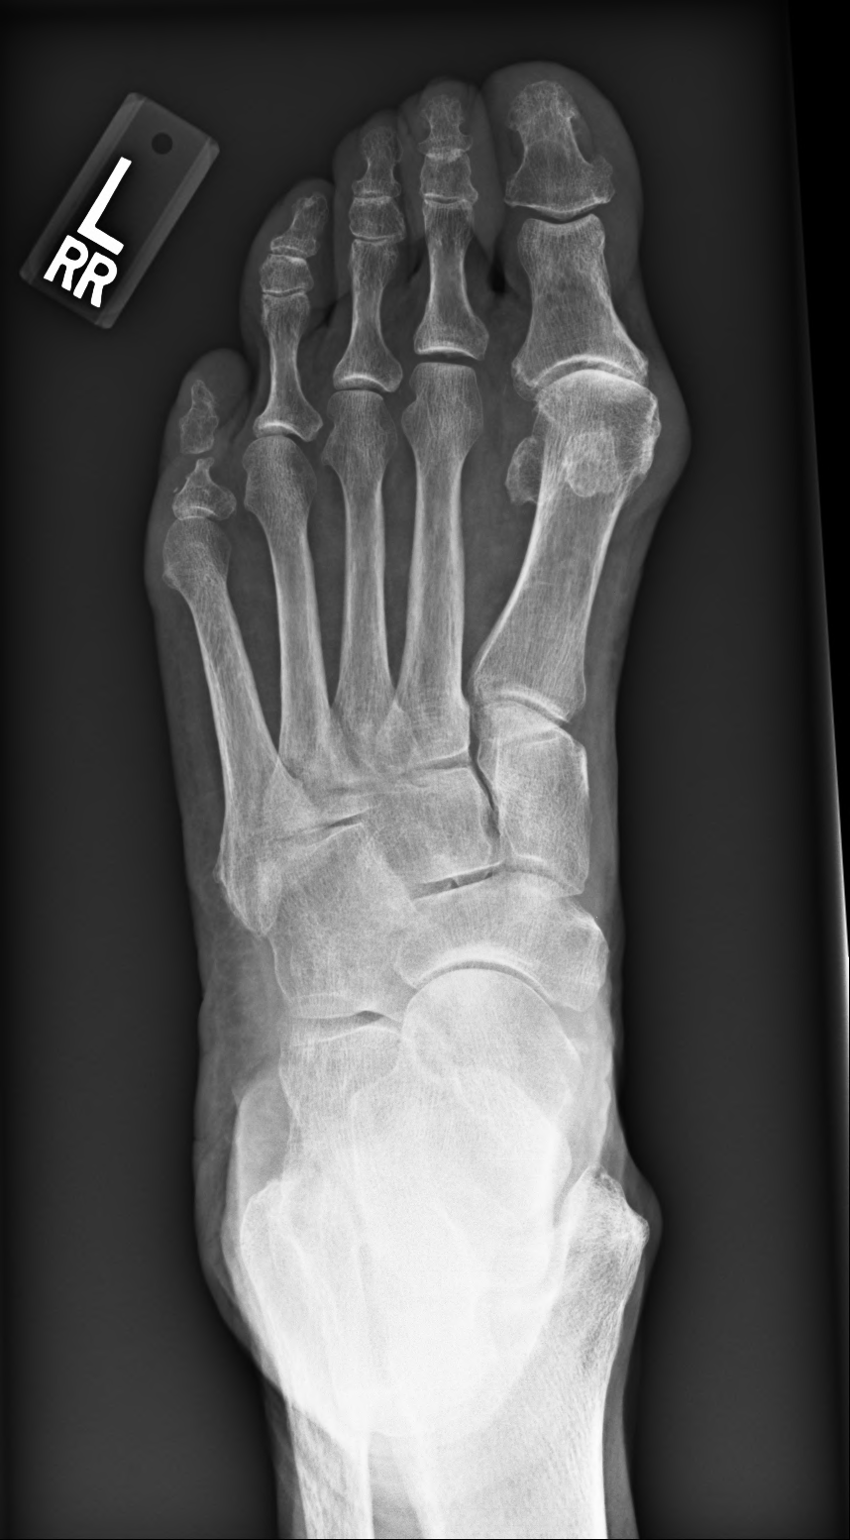
[im 3/3]
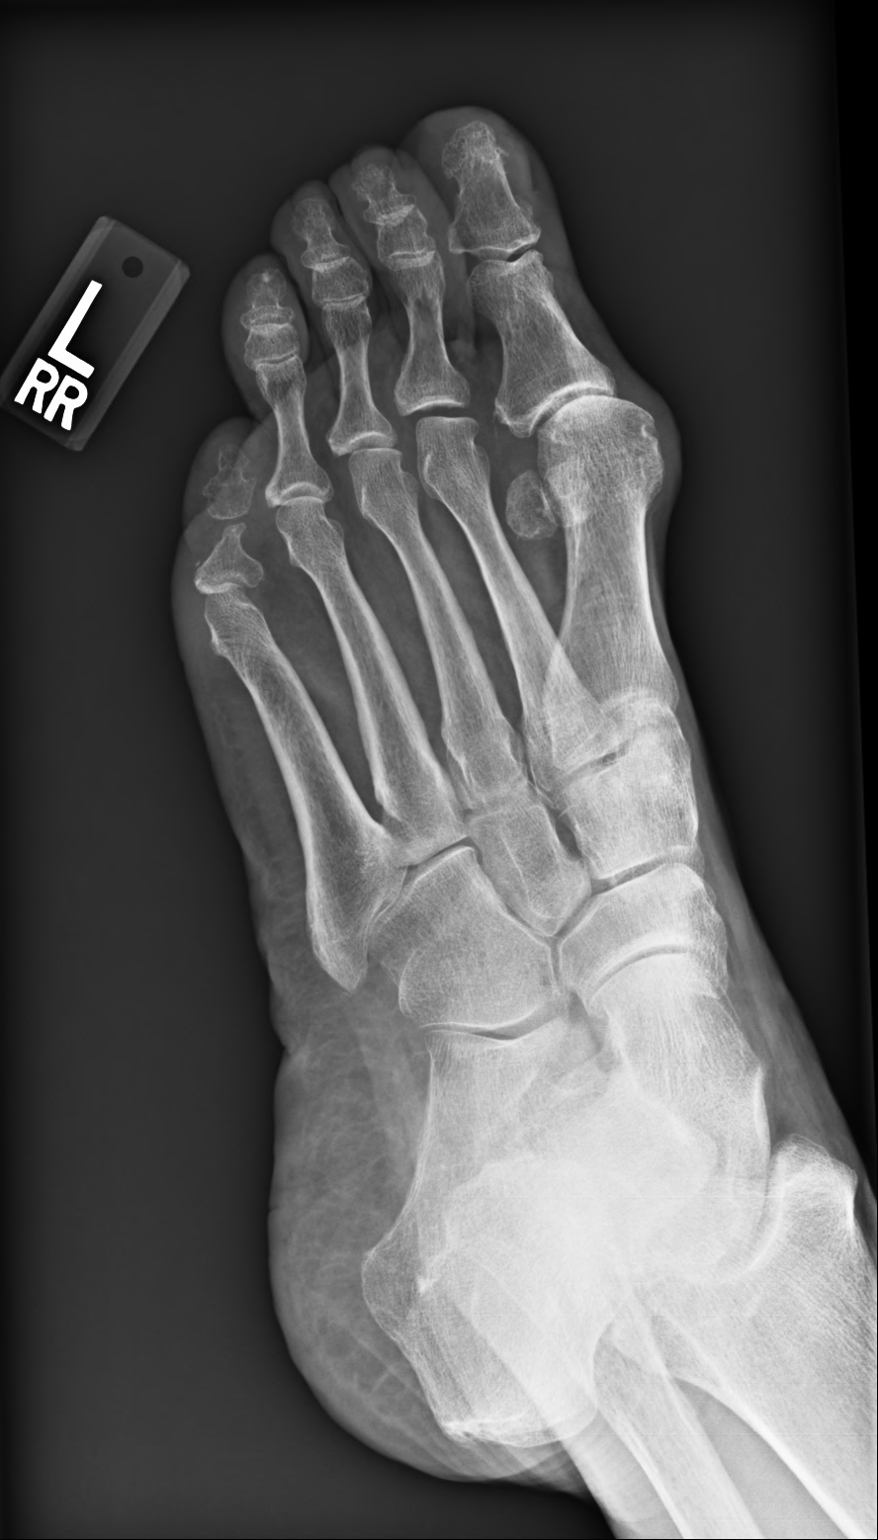

[3 of 3 positions shown; findings below may reference images not displayed]

FINDINGS: No acute fracture or dislocation. Postsurgical changes of the proximal phalanx of the fifth toe. Moderate first MTP joint osteoarthritis, moderate hallux valgus. Soft tissue thickening noted overlying the medial aspect of the first metatarsal. No erosions. Alignment of the midfoot is maintained. Mild osteoarthritis of the TMT joints.
IMPRESSION: 1.
No radiographic evidence of inflammatory arthropathy.

2.
Moderate hallux valgus with moderate first MTP joint osteoarthritis.

3.
Postsurgical changes of the proximal phalanx of the fifth toe.

## 2020-05-10 IMAGING — CR HAND LT 3 VWS MIN
1 series · 3 of 3 positions shown · non-contrast
Comparison: None.

INDICATION: Left hand pain.
TECHNIQUE: Left hand radiographs, 3 views.

[Series 1: pa · 0.17mm/px · 3 of 3 slices shown]
[im 1/3]
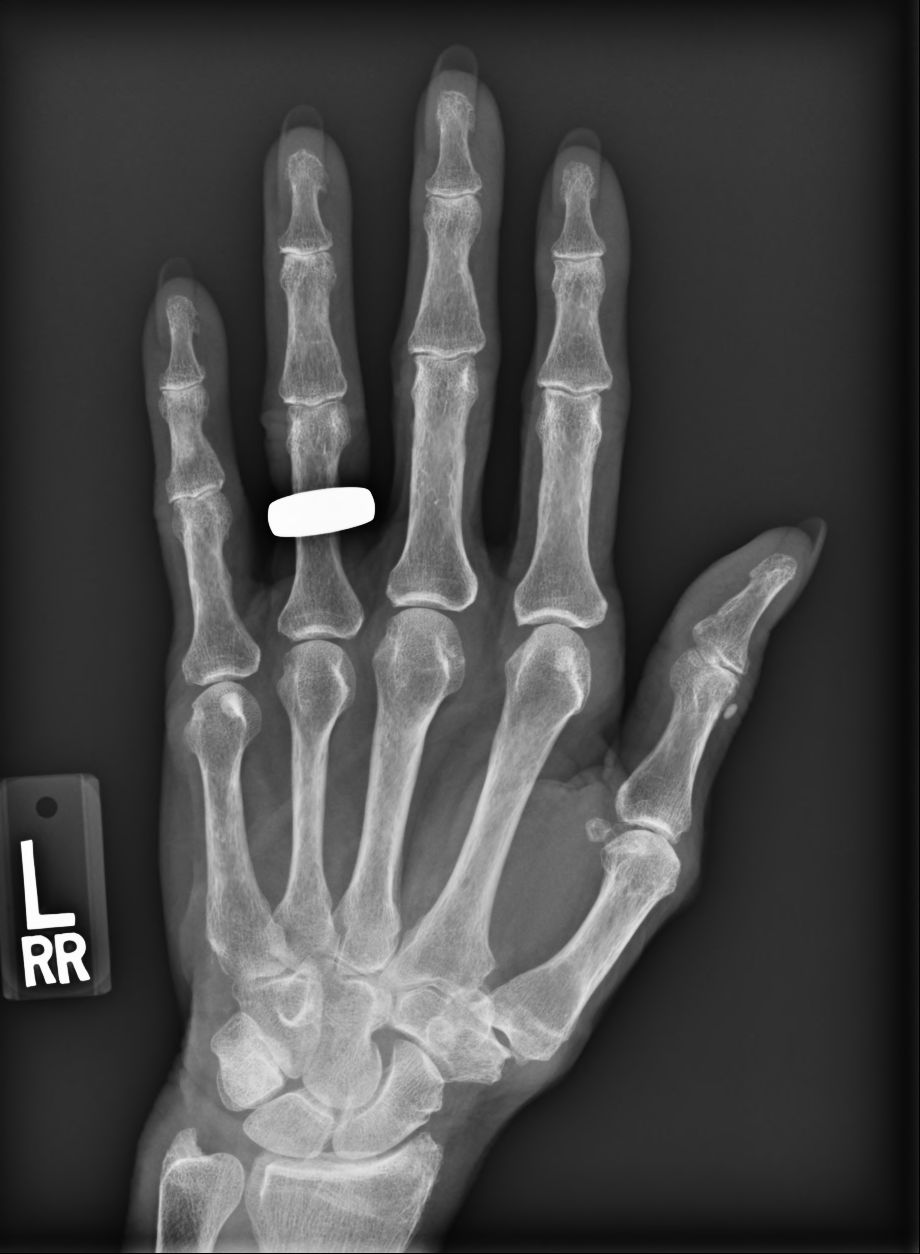
[im 2/3]
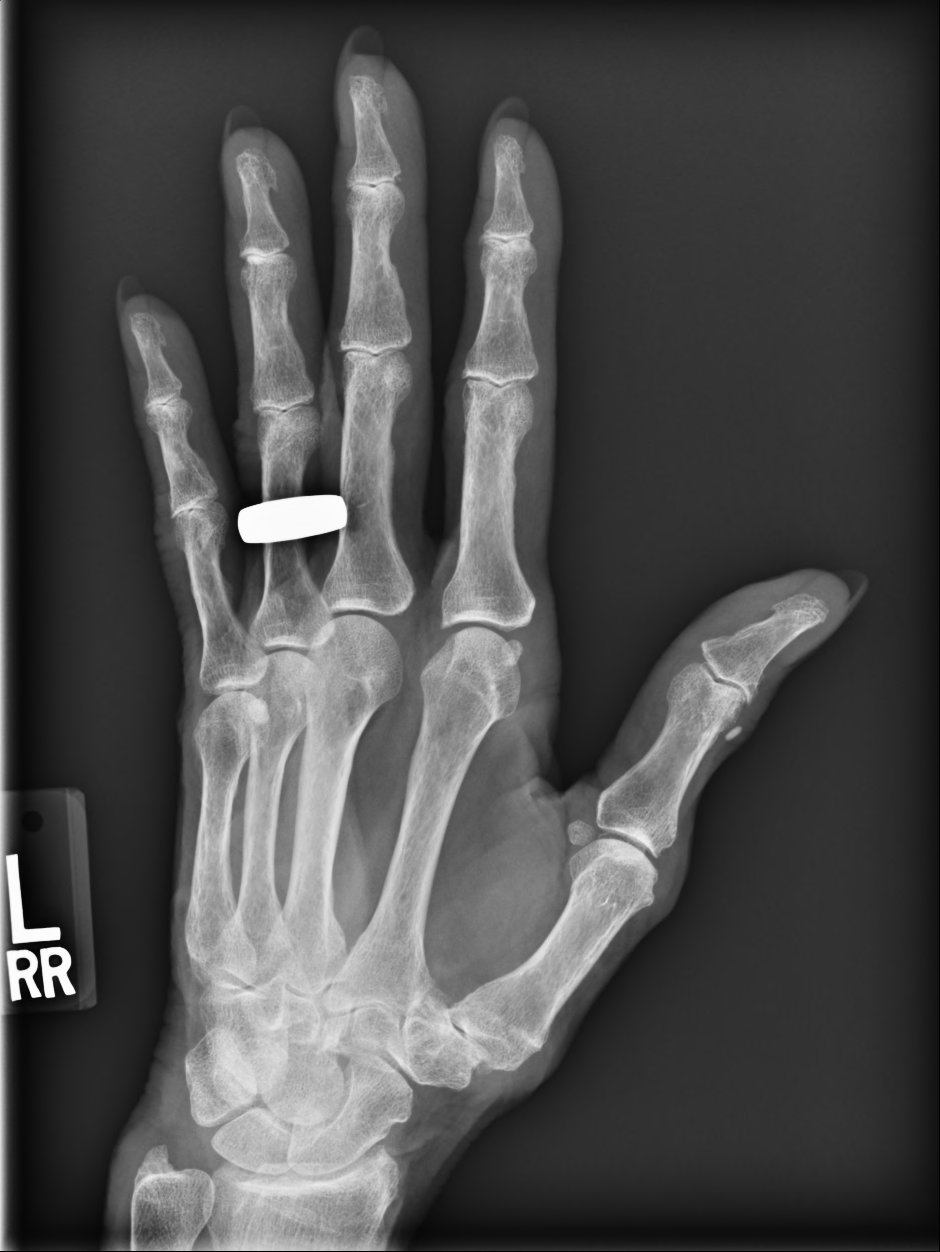
[im 3/3]
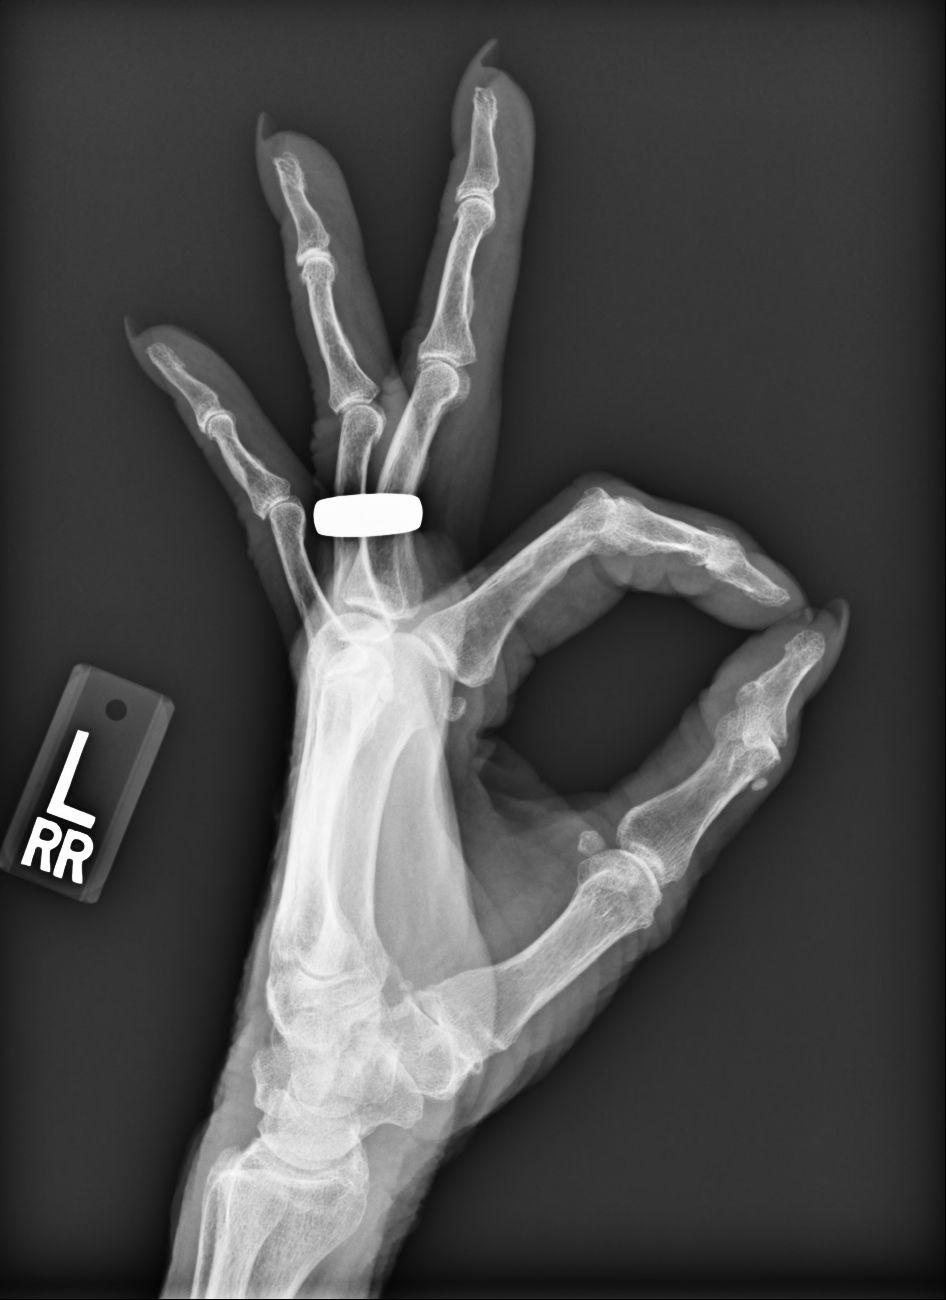

[3 of 3 positions shown; findings below may reference images not displayed]

FINDINGS: Osteopenia. No acute fracture. No dislocation. Moderate first MCP joint DJD. Severe first CMC joint DJD. Mild radiocarpal joint DJD. Mild-moderate scattered DJD of the interphalangeal joints. No foreign body. No erosion.
IMPRESSION: 1.
Severe first CMC joint DJD.

2.
Mild-moderate scattered DJD of the interphalangeal joints.

## 2020-05-10 IMAGING — CR CHEST 2 VWS PA LAT
1 series · 2 of 2 positions shown · non-contrast
Comparison: None

HISTORY: Long term drug therapy use
TECHNIQUE: PA and lateral chest radiographs

[Series 1: pa · 0.17mm/px · 2 of 2 slices shown]
[im 1/2]
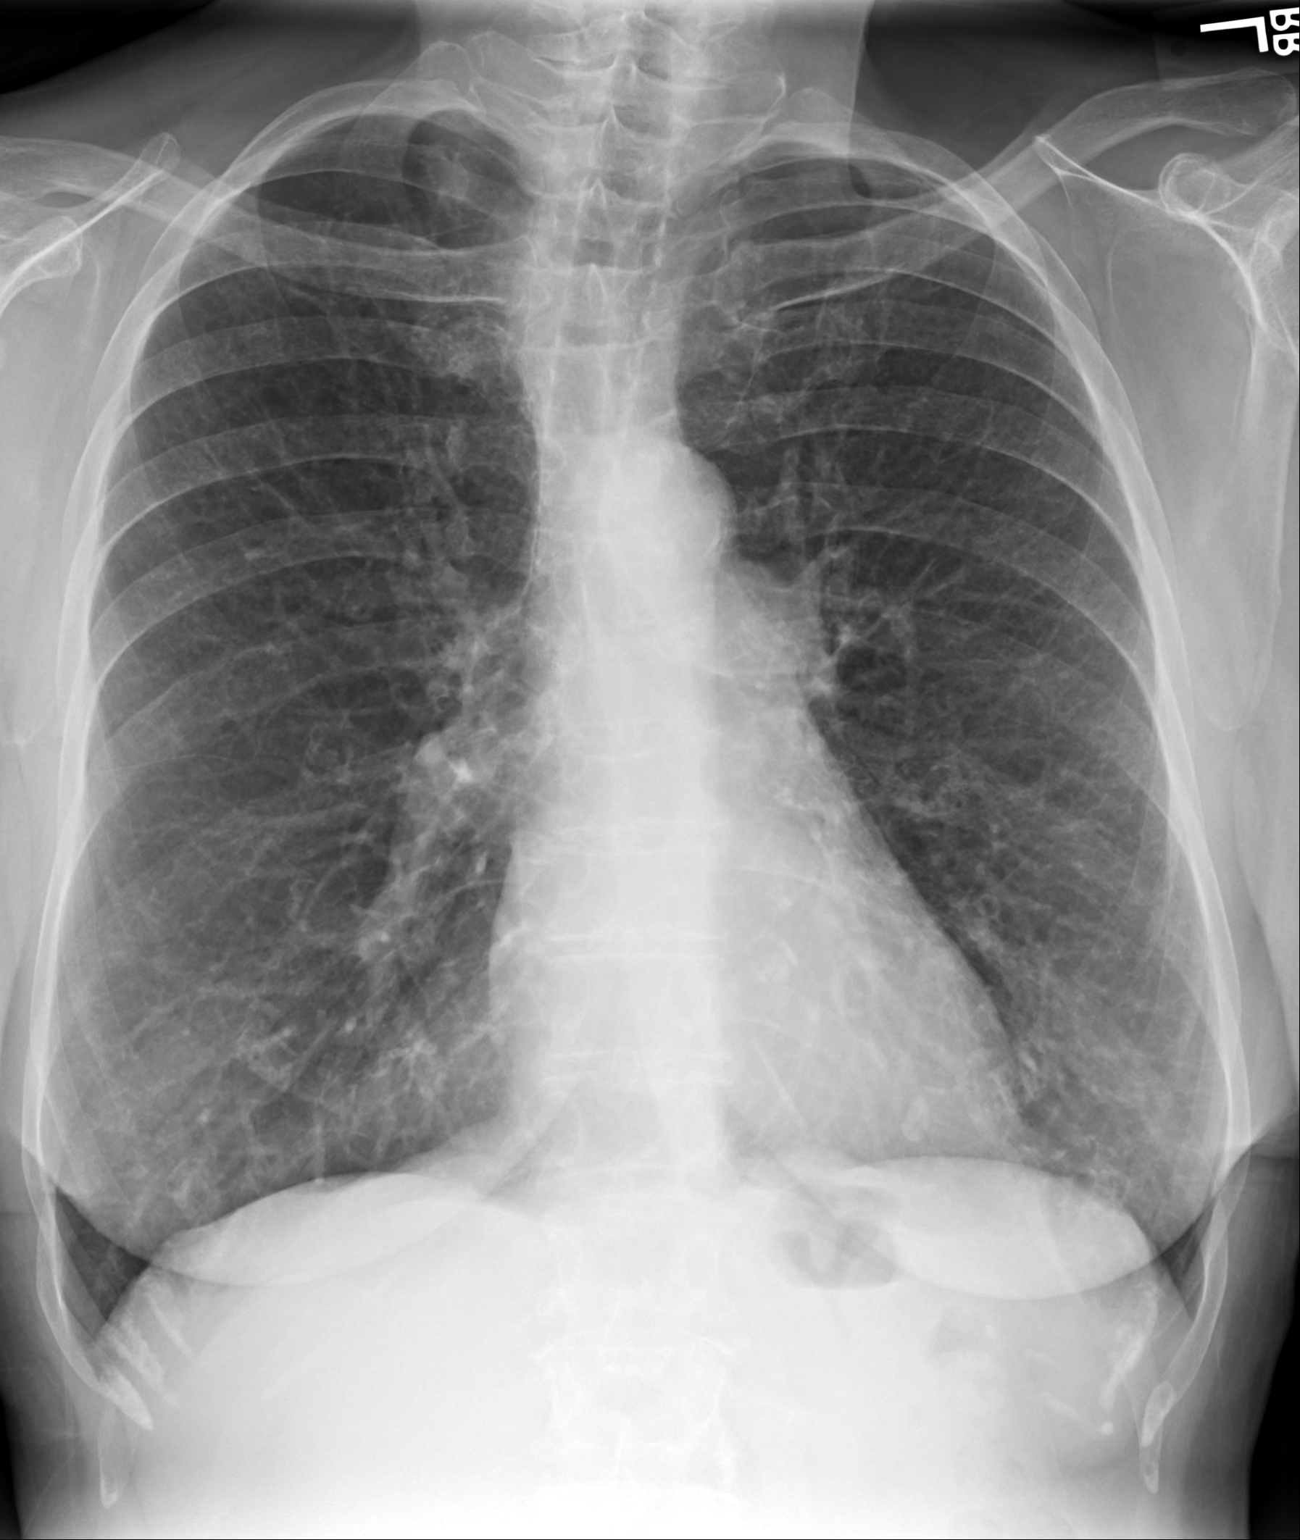
[im 2/2]
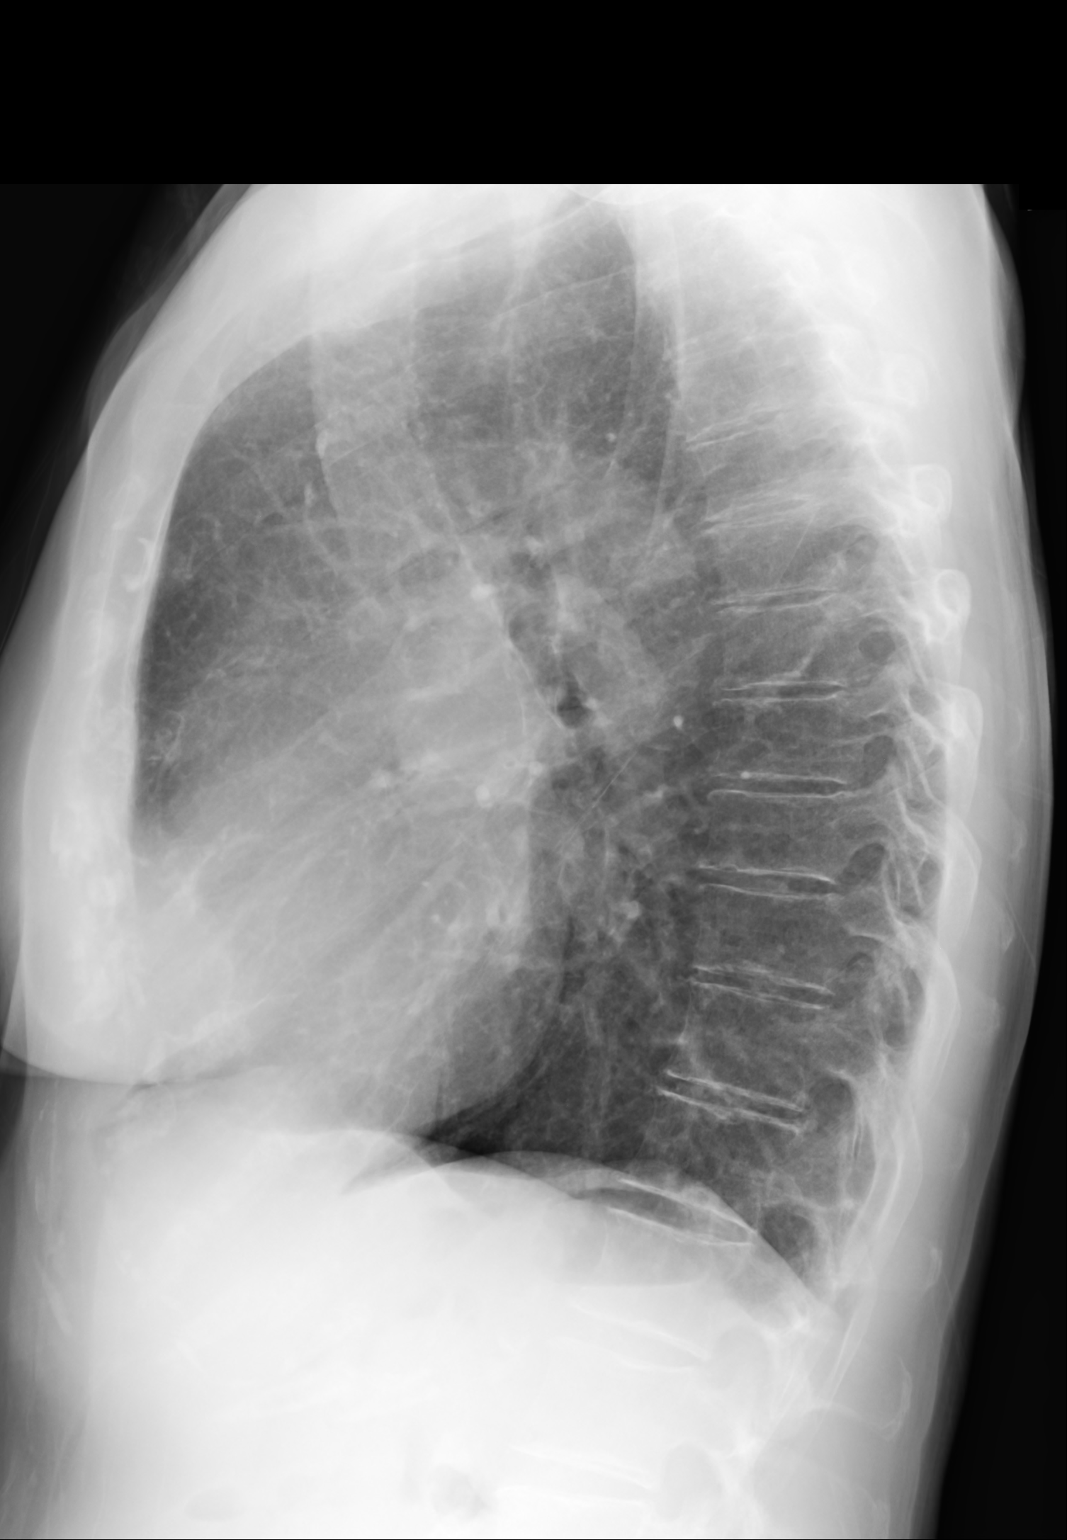

[2 of 2 positions shown; findings below may reference images not displayed]

FINDINGS: Heart size is normal. No pleural effusion or pneumothorax. No consolidation. Hyperexpanded lungs with flattening of the diaphragms, compatible with COPD.
IMPRESSION: No acute chest findings.

COPD.

## 2020-05-10 IMAGING — CR HAND RT 3 VWS MIN
1 series · 3 of 3 positions shown · non-contrast
Comparison: None

Right hand radiographs, 3 views
INDICATION: Right hand pain

[Series 1: pa · 0.17mm/px · 3 of 3 slices shown]
[im 1/3]
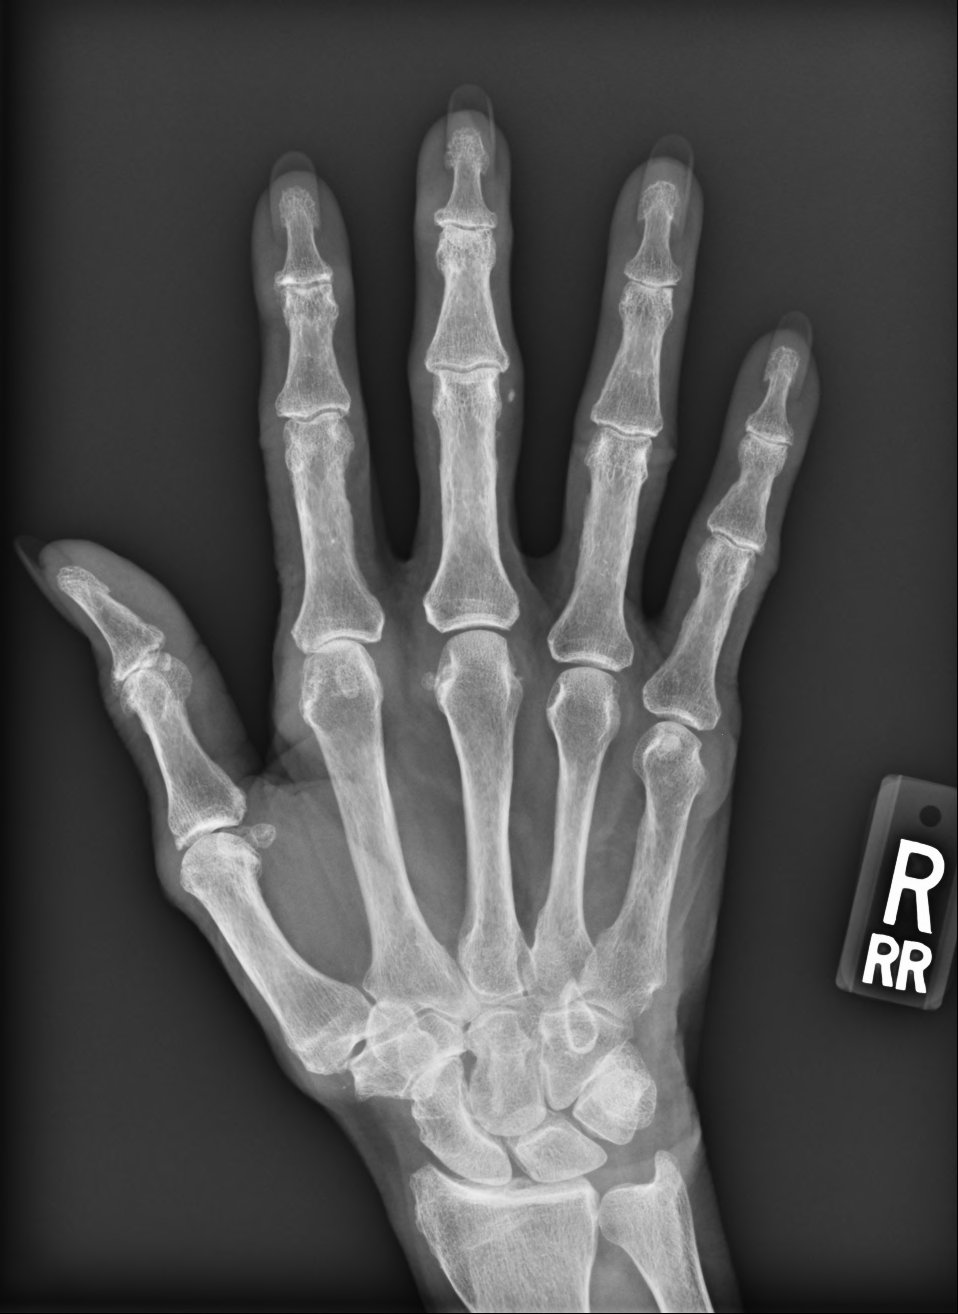
[im 2/3]
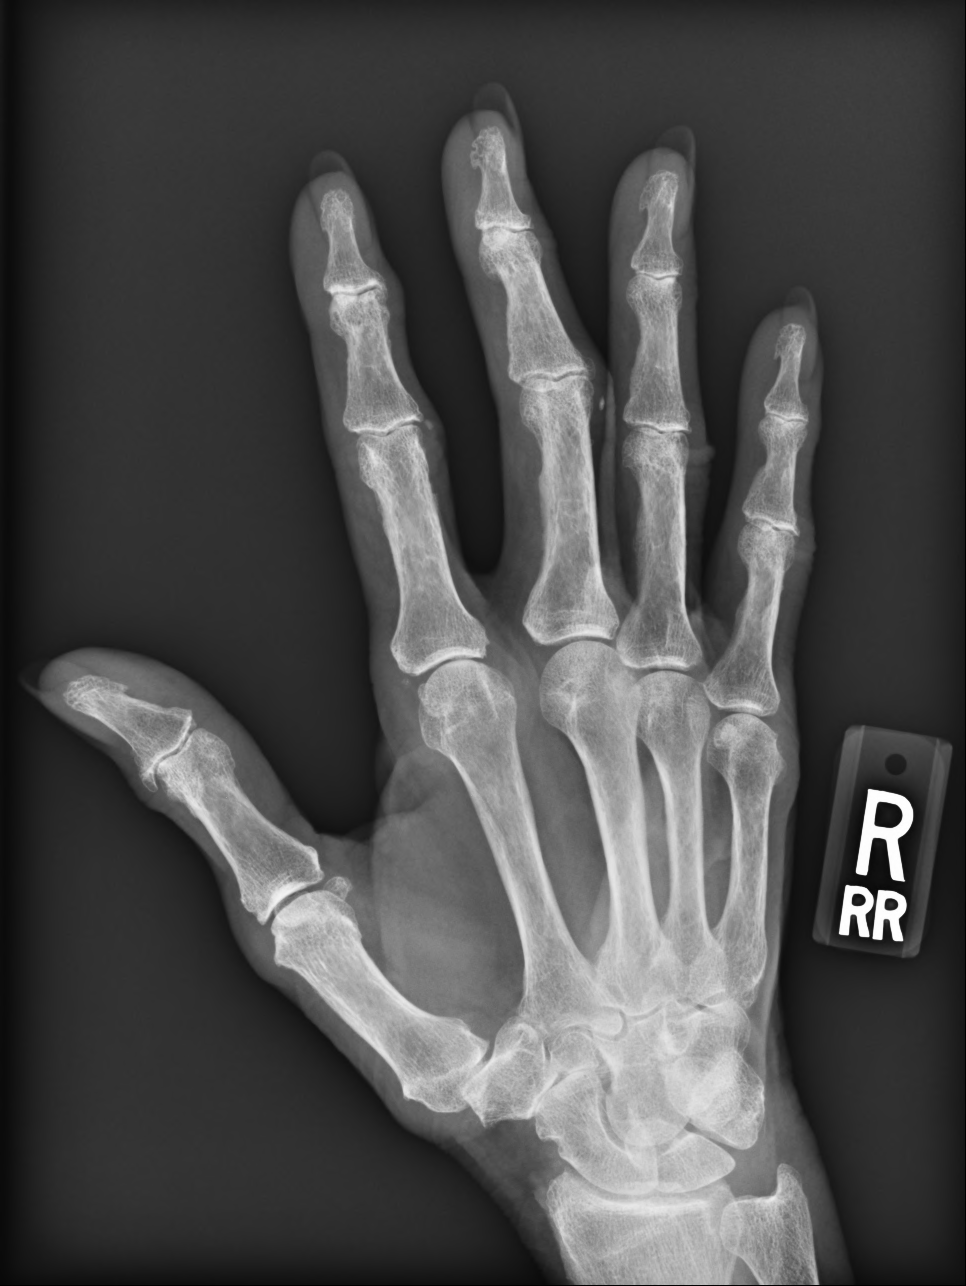
[im 3/3]
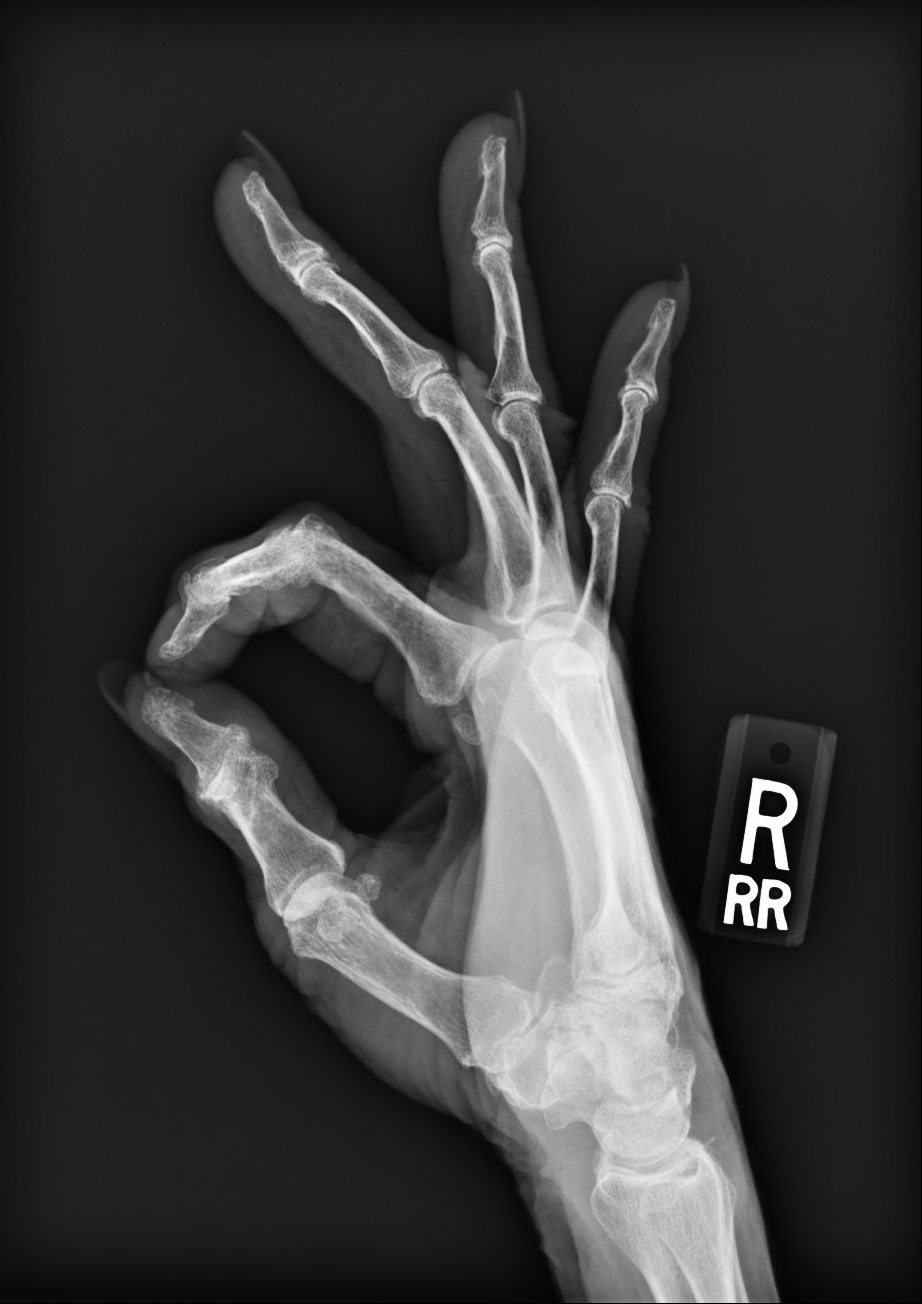

[3 of 3 positions shown; findings below may reference images not displayed]

FINDINGS: No acute fracture or dislocation. Mild radiocarpal joint DJD. Moderate triscaphe joint DJD. Mild first CMC joint DJD. Mild/moderate scattered degenerative the MCP joints. Moderate scattered DJD of the interphalangeal joints. Tiny calcification along the ulnar aspect of the third PIP joint, suggesting old ligament avulsion injury.
IMPRESSION: Moderate diffuse DJD of the right hand/wrist.

## 2021-03-28 IMAGING — MR MRI LSPINE WO CONTRAST
4 of 5 series · 26 of 48 positions shown · non-contrast
Comparison: Plain radiography of lumbar spine dated same day.

HISTORY: 75 year-old female with lumbar radiculopathy.
TECHNIQUE: Multiplanar multisequential MR images were obtained of the lumbar spine without contrast administration.

[Series 2: t2_sag · sagittal · 4.0mm · 0.68mm/px · 6 of 15 slices shown]
[im 1/15]
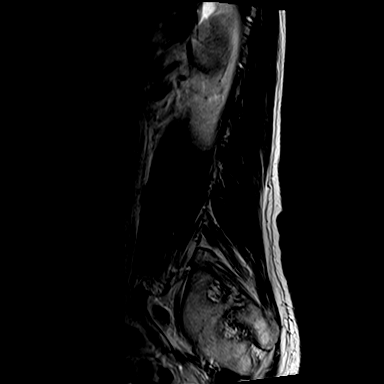
[im 3/15]
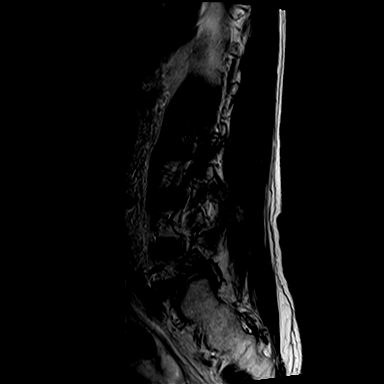
[im 6/15]
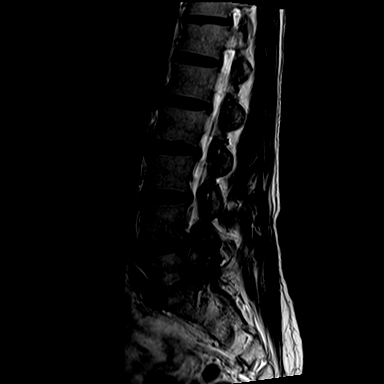
[im 9/15]
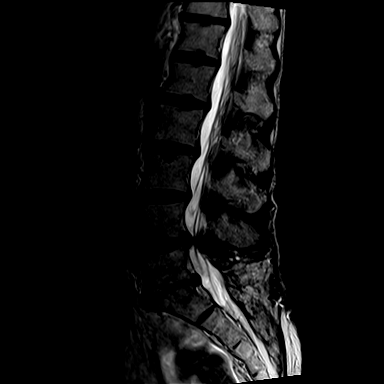
[im 12/15]
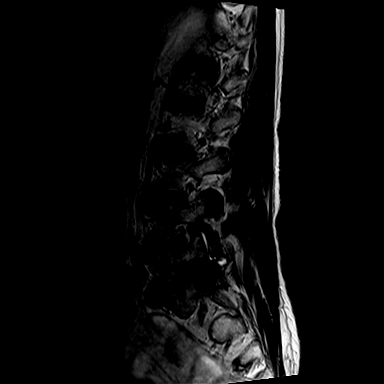
[im 15/15]
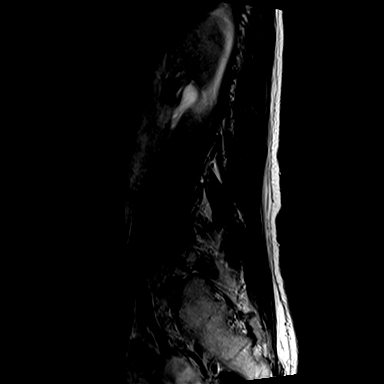

[Series 3: t1_sag · sagittal · 4.0mm · 0.81mm/px · 6 of 15 slices shown]
[im 1/15]
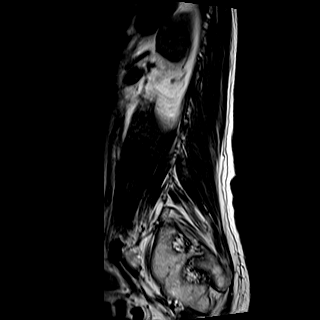
[im 3/15]
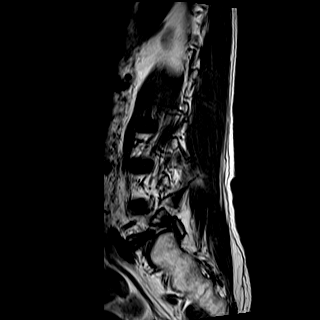
[im 6/15]
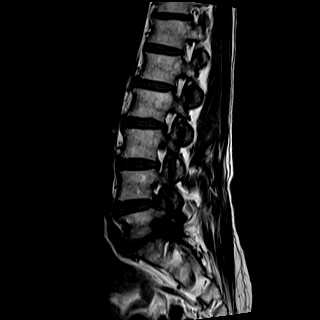
[im 9/15]
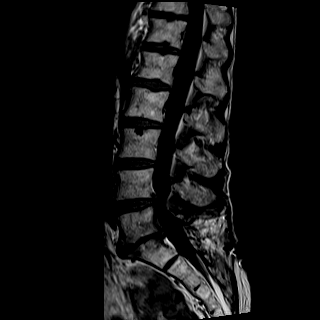
[im 12/15]
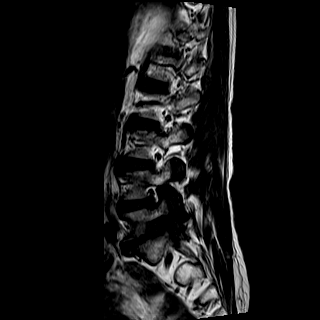
[im 15/15]
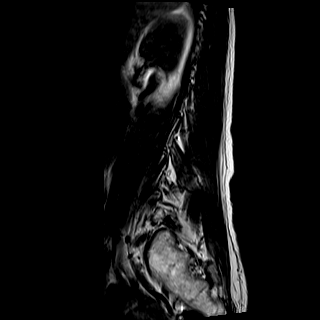

[Series 4: ir_sag · sagittal · 4.0mm · 0.51mm/px · 7 of 15 slices shown]
[im 1/15]
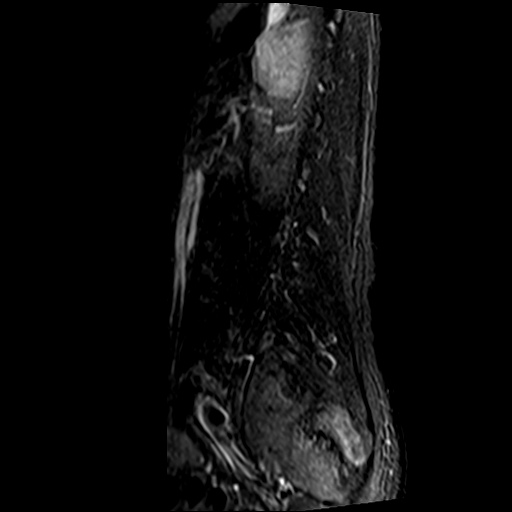
[im 3/15]
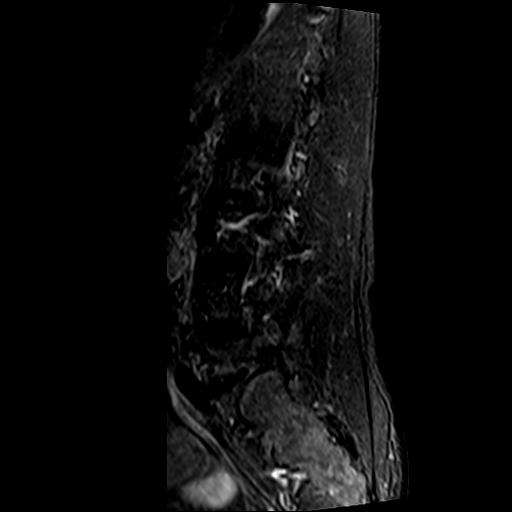
[im 5/15]
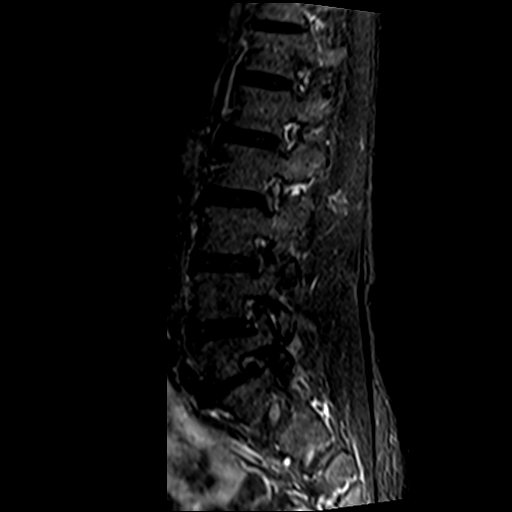
[im 8/15]
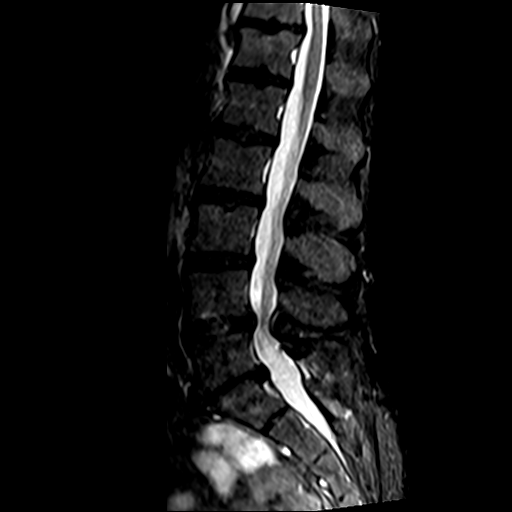
[im 10/15]
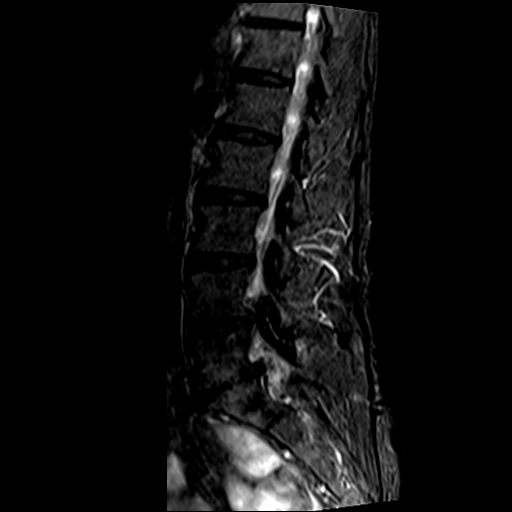
[im 12/15]
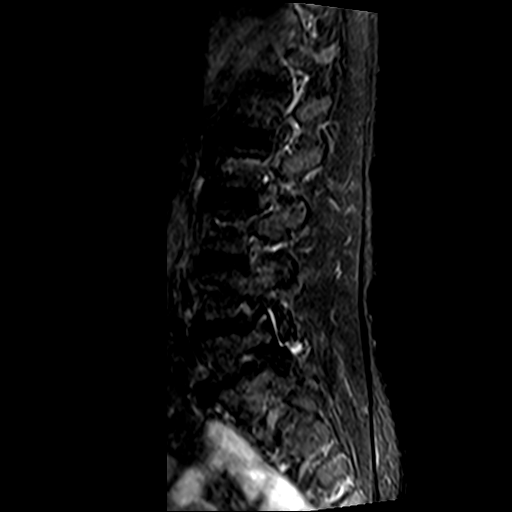
[im 15/15]
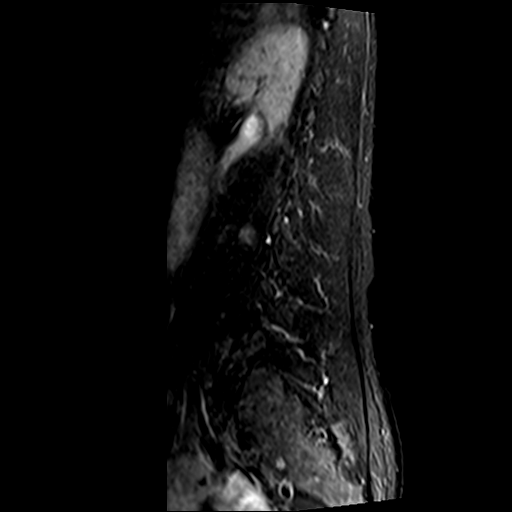

[Series 7: t1_axial_obl · axial · 3.5mm · 0.35mm/px · z∈[-108,+42]mm · 7 of 29 slices shown]
[im 1/29]
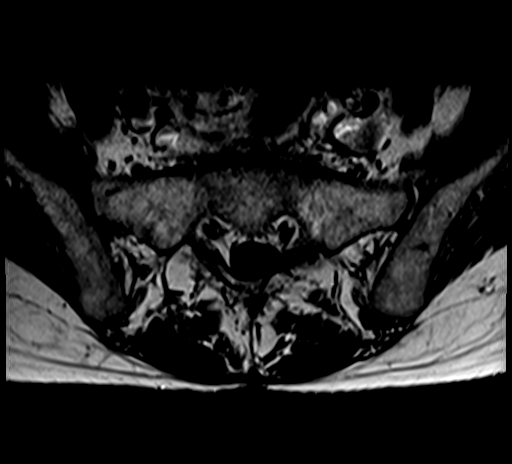
[im 5/29]
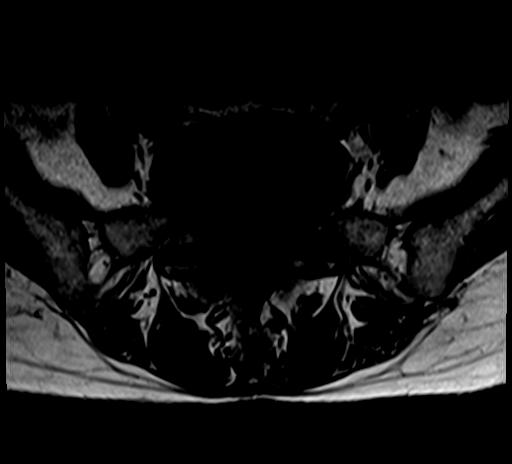
[im 10/29]
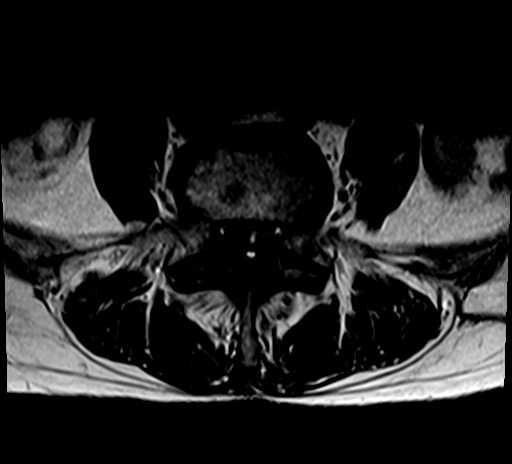
[im 12/29]
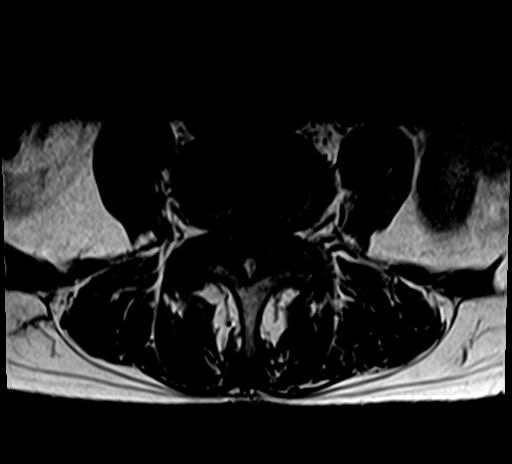
[im 15/29]
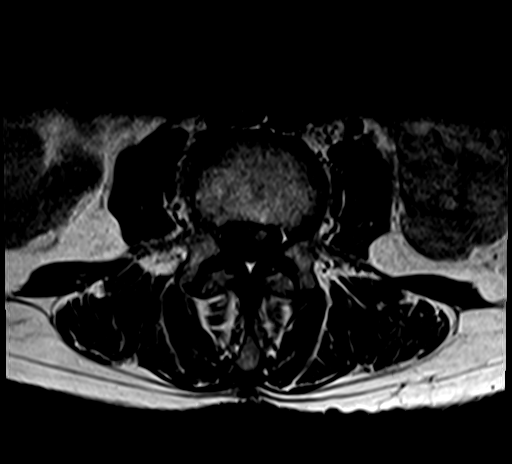
[im 17/29]
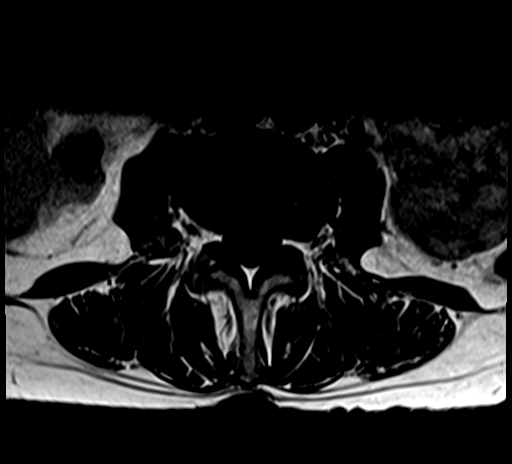
[im 24/29]
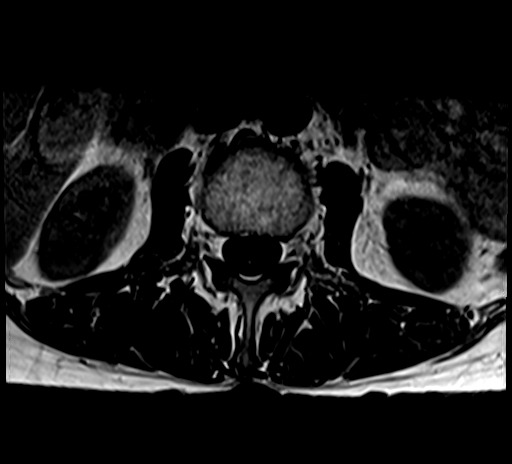

[26 of 48 positions shown; findings below may reference images not displayed]

FINDINGS: T12-L1: No disc protrusion, canal stenosis, or foraminal stenosis.

L1-L2: No disc protrusion, canal stenosis, or foraminal stenosis.

L2-L3: No disc protrusion, canal stenosis, or foraminal stenosis.

L3-L4: No disc protrusion, canal stenosis, or foraminal stenosis.

L4-L5: Large diffuse degenerated disc bulge with slight hard disc component. 1 mm retrolisthesis of L4 behind 5. Moderate facet arthropathy with ligamentum flavum buckling to 7 mm. The combination of findings results in moderate to severe spinal stenosis with no CSF around the nerve roots. Moderate to severe bilateral foraminal narrowings.

L5-S1: Large diffuse degenerated disc bulge that extends laterally out into the foramina resulting in severe  foraminal narrowings although no spinal stenosis.

Visualized SI joints: Intact

Visualized soft tissues: Unremarkable
IMPRESSION: 1. Moderate to severe L4-5 spinal stenosis and foraminal narrowings.

2. Severe bilateral L5-S1 foraminal narrowings.

## 2021-03-28 IMAGING — CR [HOSPITAL] L SPINE
1 series · 2 of 2 positions shown · non-contrast
Comparison: None.

REASON FOR EXAM: Lumbar radiculopathy.

[Series 1: ap · 0.17mm/px · 2 of 2 slices shown]
[im 1/2]
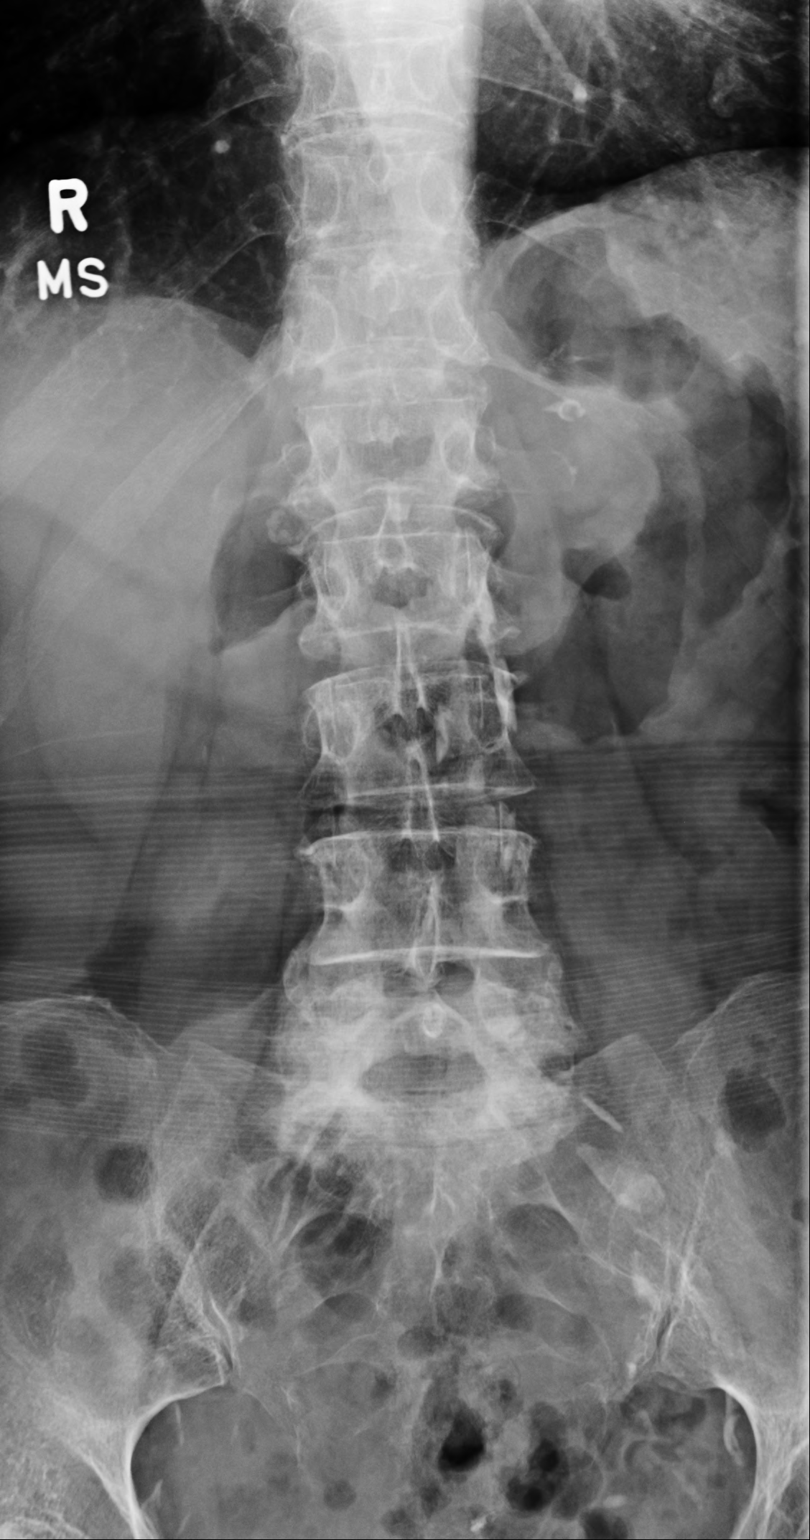
[im 2/2]
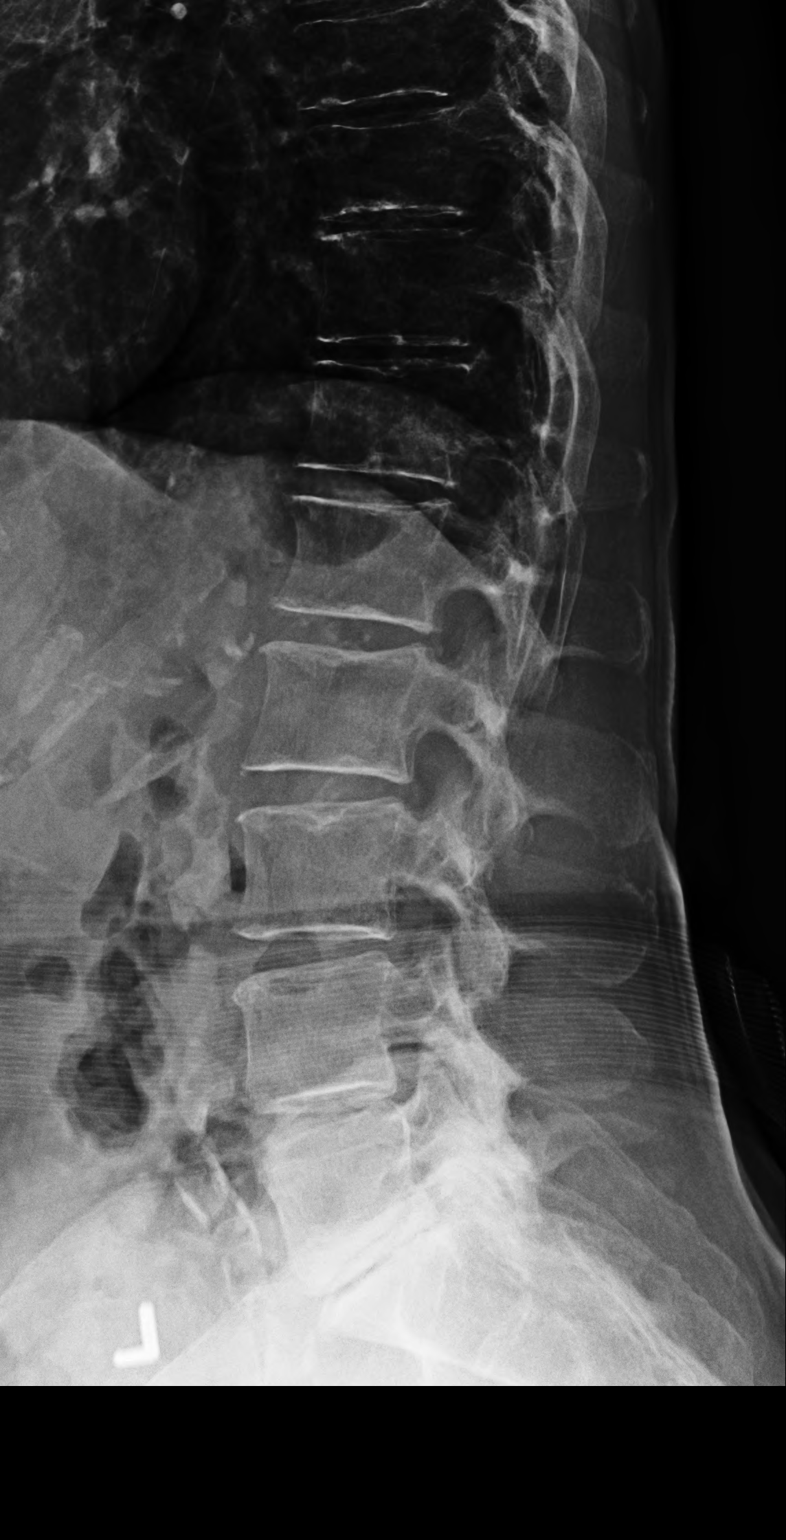

[2 of 2 positions shown; findings below may reference images not displayed]

TECHNIQUE AND FINDINGS: 2 views lumbar spine show 5 lumbar-type vertebral bodies which have a normal height and alignment. There is severe disc narrowing at L5-S1 with endplate sclerosis. Facet arthropathy seen at L5-S1. Mild disc narrowing is seen at L4-5.
IMPRESSION: Severe degenerative disc disease L5-S1 with mild disease at L4-5.

## 2021-04-05 IMAGING — US DOP CAROTID BILATERAL
1 series · 14 of 24 positions shown · non-contrast
Comparison: None.

HISTORY: 75-year-old female with bilateral carotid bruits.
TECHNIQUE: Gray-scale color Doppler and spectral Doppler imaging of the bilateral carotid and vertebral arteries is performed.

[Series 1: dop carotid bilateral · 14 of 54 slices shown]
[im 1/54]
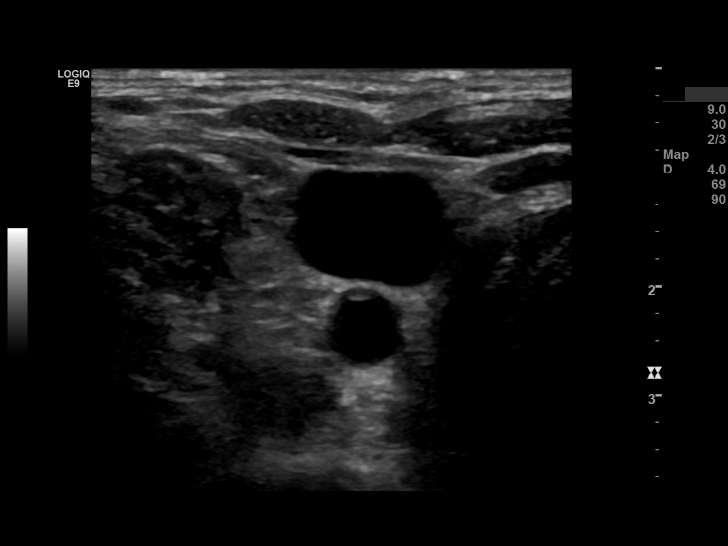
[im 5/54]
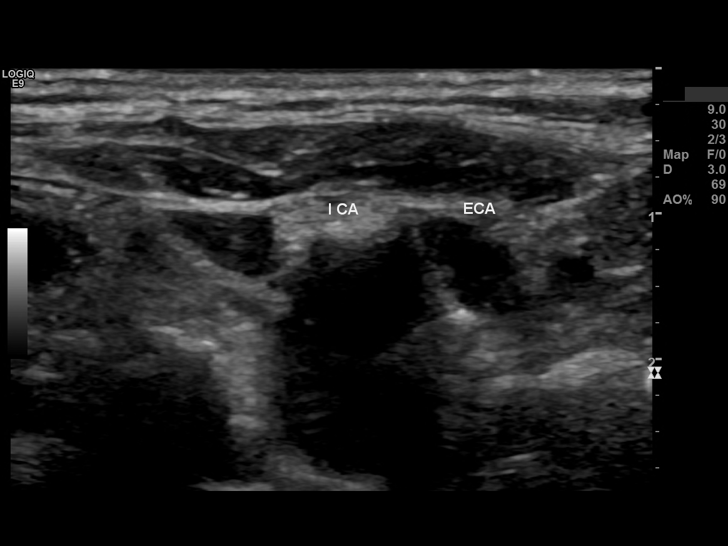
[im 10/54]
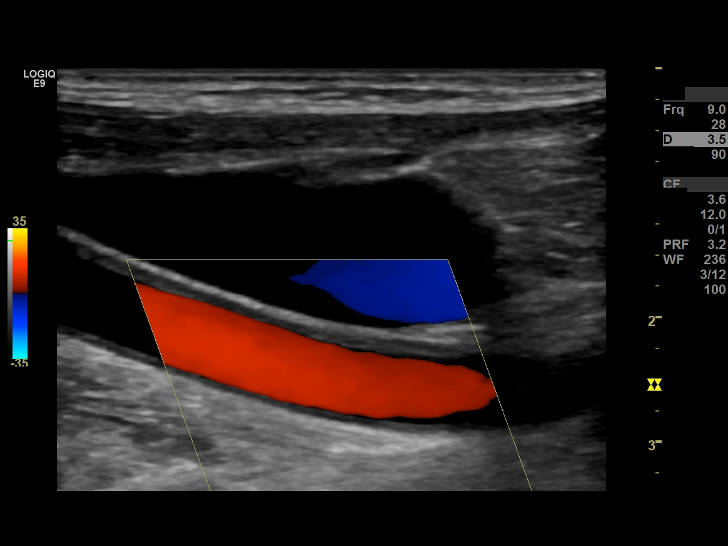
[im 14/54]
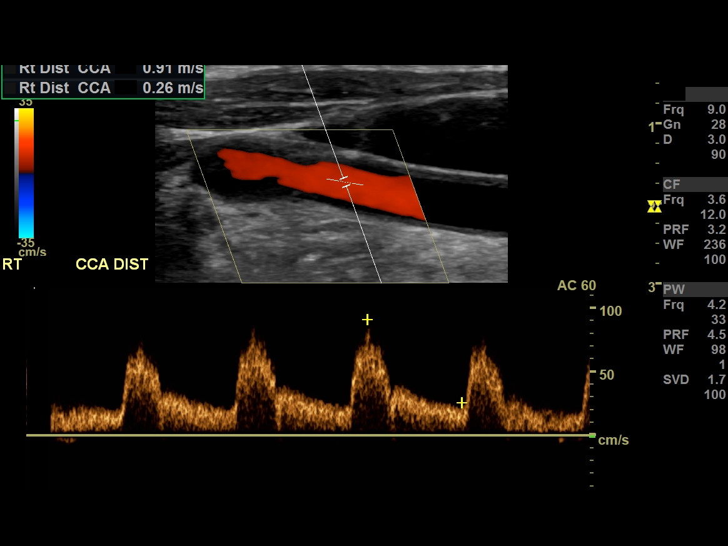
[im 17/54]
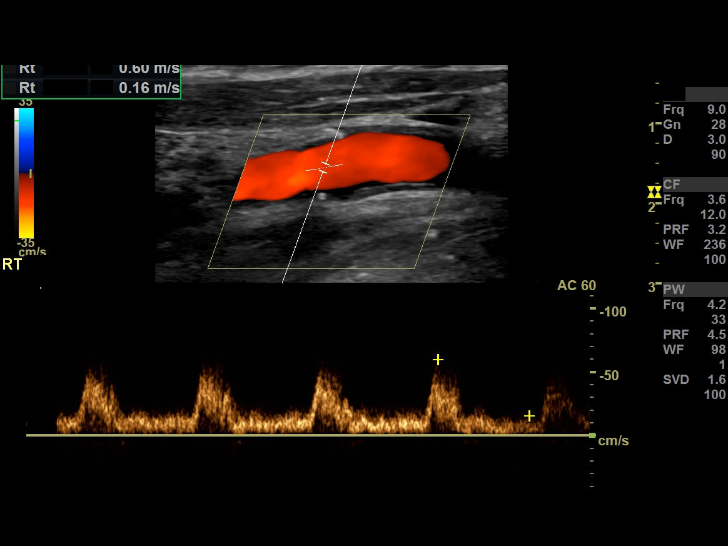
[im 21/54]
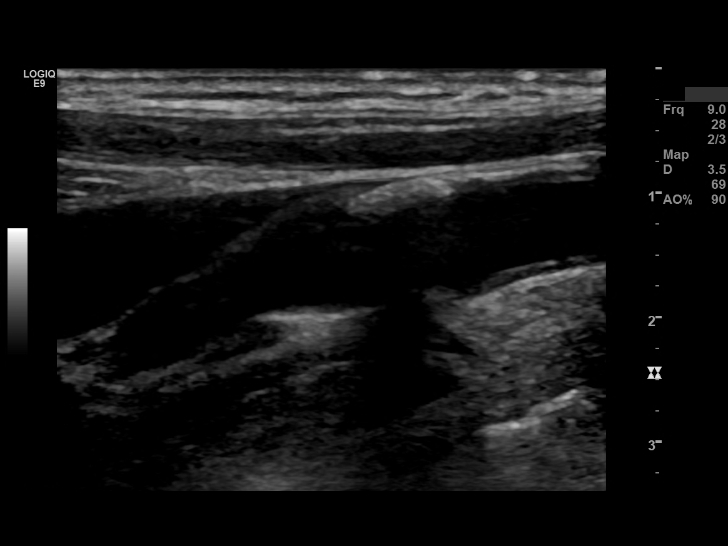
[im 26/54]
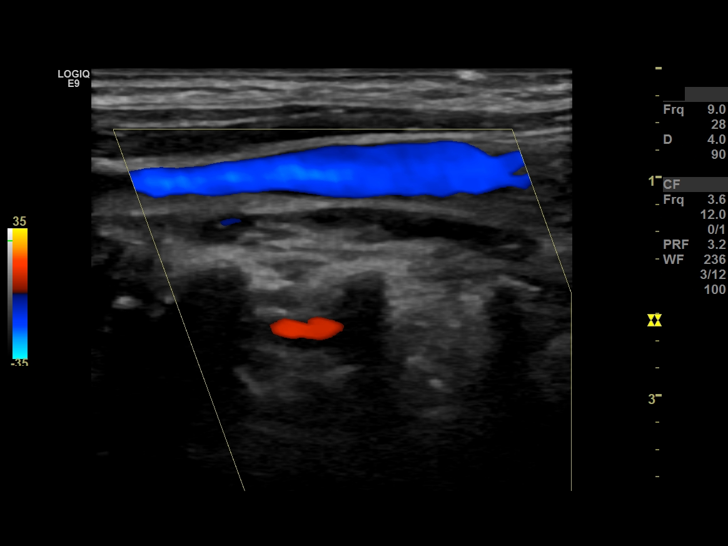
[im 28/54]
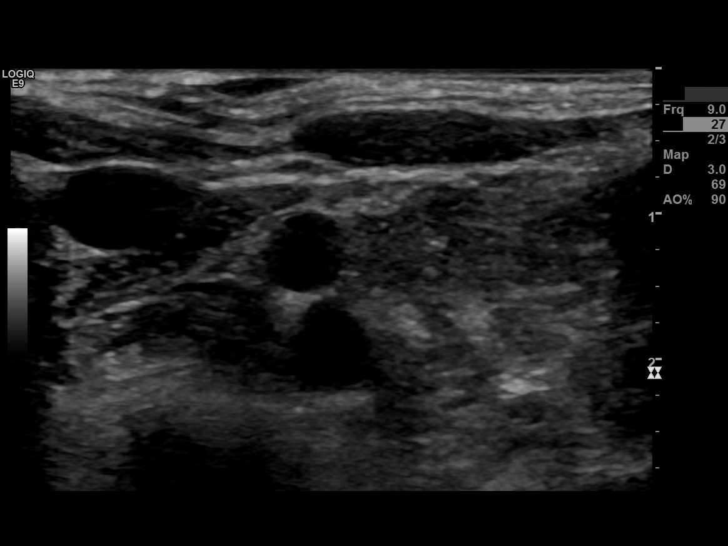
[im 33/54]
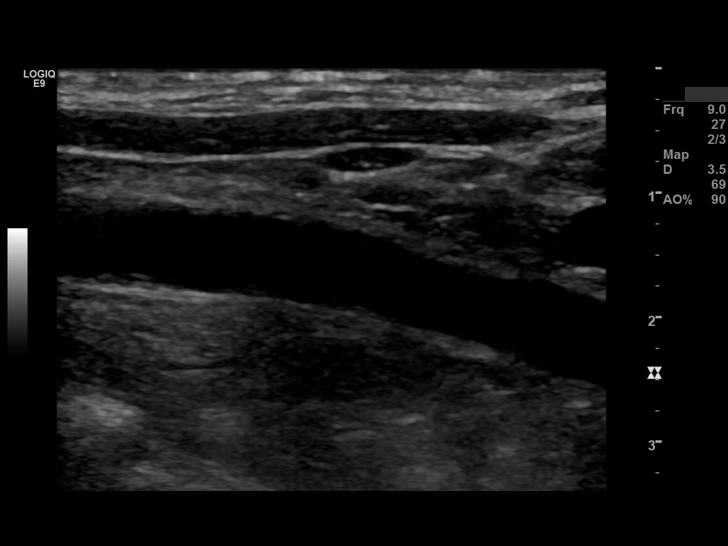
[im 37/54]
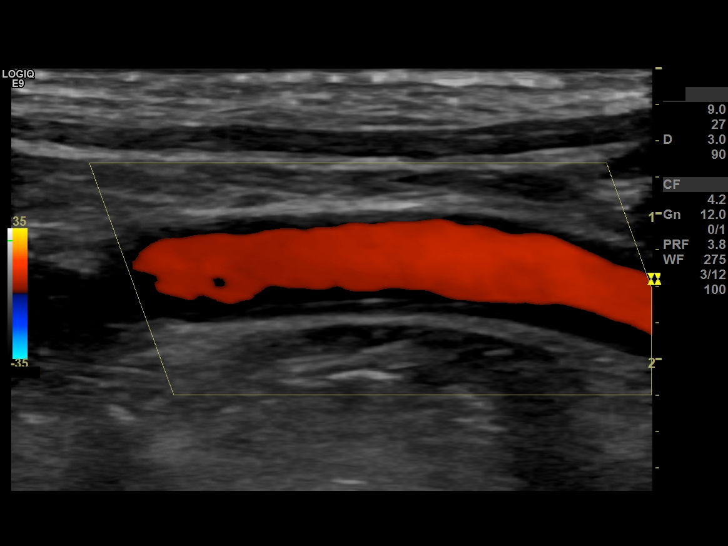
[im 42/54]
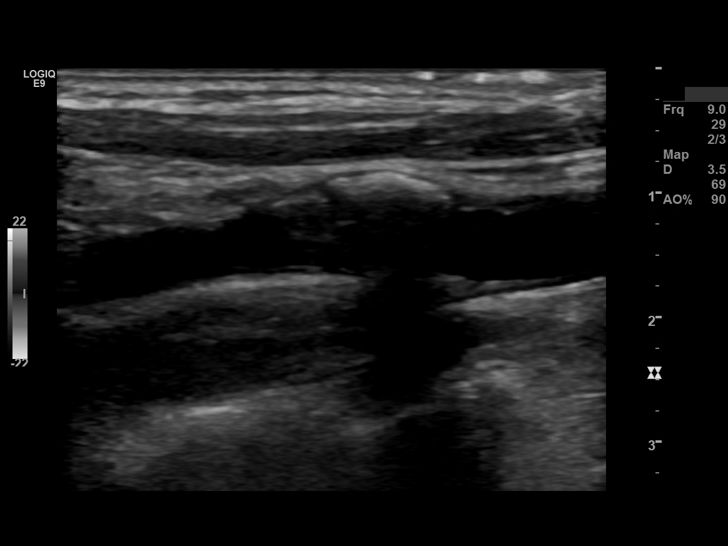
[im 44/54]
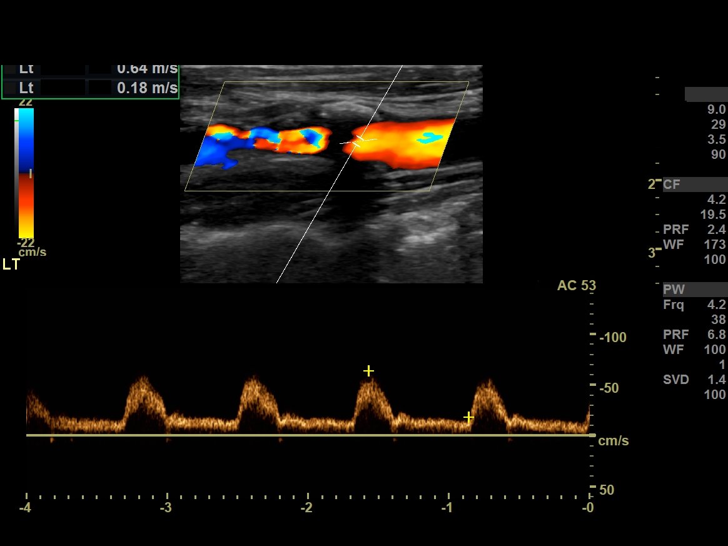
[im 49/54]
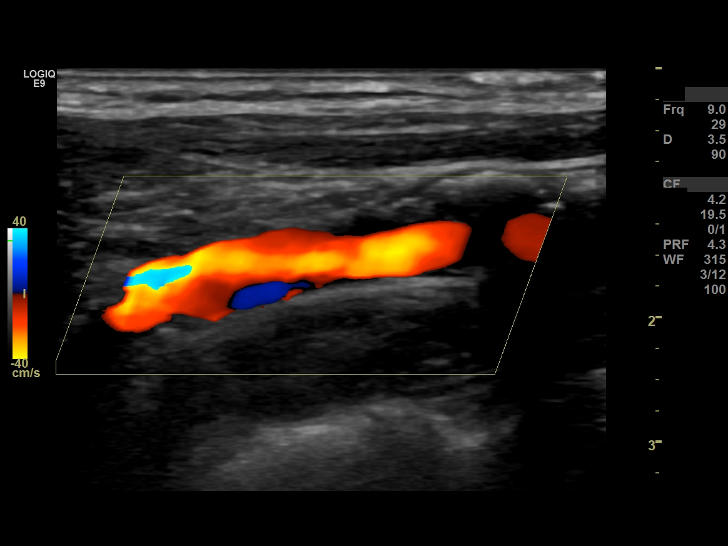
[im 54/54]
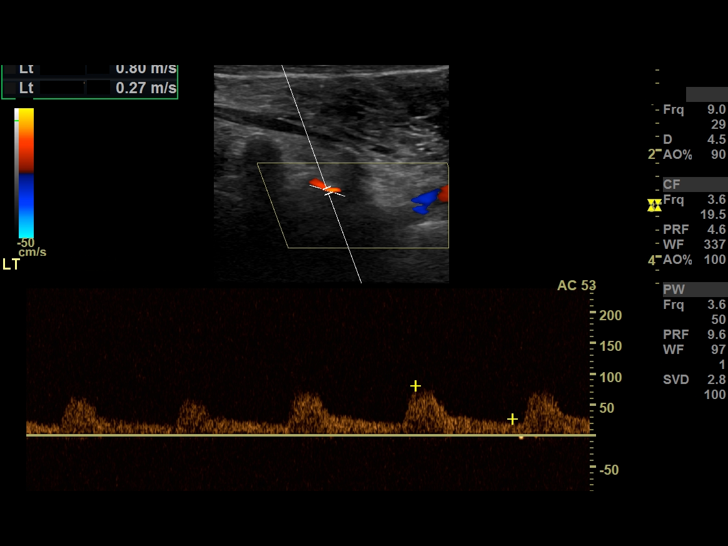

[14 of 24 positions shown; findings below may reference images not displayed]

FINDINGS: Gray-scale soft/calcified carotid plaques: Bilateral carotid bulb and proximal ICA calcified and uncalcified plaquing, left worse than right.

CARDIAC RHYTHM: REGULAR

RIGHT CAROTID ARTERY:

ICA peak systolic velocity: 0.72  m/s

CCA peak systolic velocity: 1.00 m/s

ICA/CCA Ratio:

ICA end-diastolic velocity: 0.28 m/s

ECA velocity:  0.51 m/s

Vertebral artery peak systolic velocity: 0.48 m/s

Vertebral artery direction: ***

LEFT CAROTID ARTERY:

ICA peak systolic velocity: 2.01 m/s

CCA peak systolic velocity: 0.87 m/s

ICA/CCA Ratio:

ICA end-diastolic velocity: 0.72 m/s

ECA velocity:  0.62 m/s

Vertebral artery peak systolic velocity: 0.80 m/s

Vertebral artery direction: ANTEGRADE
IMPRESSION: 1. 50-70% left ICA stenosing plaque. Less than 50% on the right.

Stenosis velocity criteria are extrapolated from diameter data as defined by the Society of Radiologists in Ultrasound Consensus Conference Radiology 9991; 229; 304-346

## 2021-04-19 IMAGING — MR MRA NECK WO/W CONTRAST
4 of 6 series · 18 of 48 positions shown · IV contrast (gadolinium)
Comparison: Correlation is made to previous bilateral carotid Doppler ultrasound April 05, 2021

INDICATION: Bilateral carotid bruit with approximately 50-70% stenosis of the left cervical internal carotid artery and 50% stenosis of the right cervical internal carotid artery by previous carotid Doppler ultrasound April 05, 2021.
TECHNIQUE: MRA of the neck and brain without and with intravenous contrast. Two-dimension time-of-flight MR angiography of the neck arteries and the intracranial arteries are performed without IV contrast.  This is followed by a 3D gadolinium bolus enhanced MR angiogram of the neck and brain using 20 cc IV ProHance. 2D reconstruction including coronal and sagittal images and 3D reconstruction using MIP and volume rendering algorithms are performed.

[Series 2: tof_fl2d_axial · axial · 3.0mm · 0.43mm/px · z∈[-70,+88]mm · 8 of 80 slices shown]
[im 1/80]
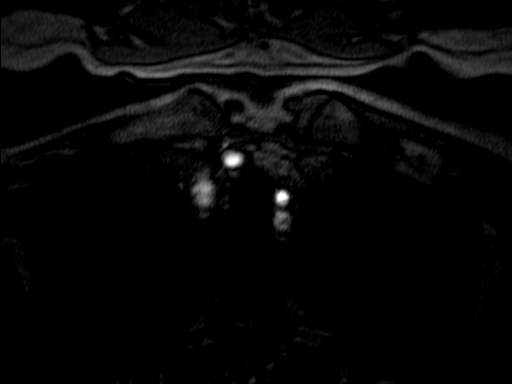
[im 9/80]
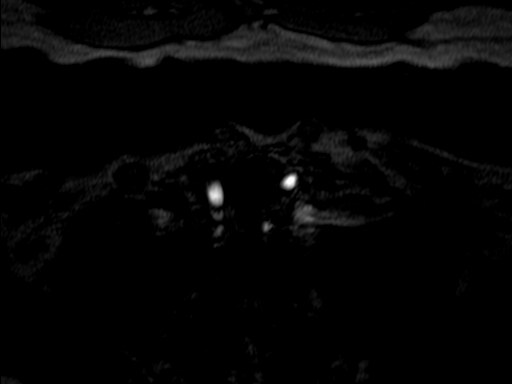
[im 27/80]
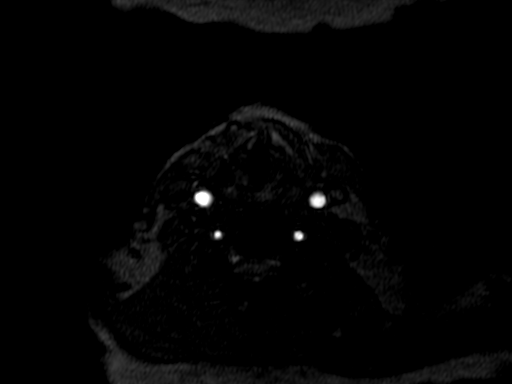
[im 36/80]
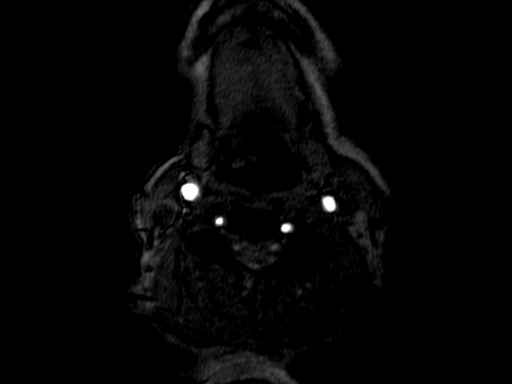
[im 44/80]
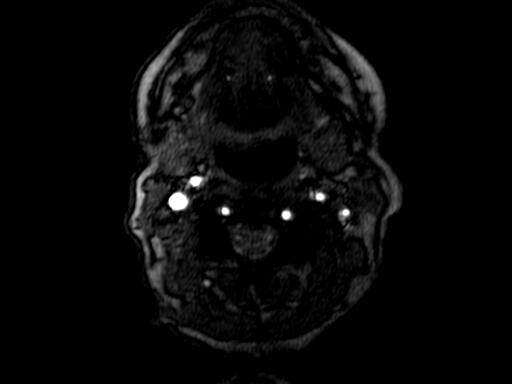
[im 53/80]
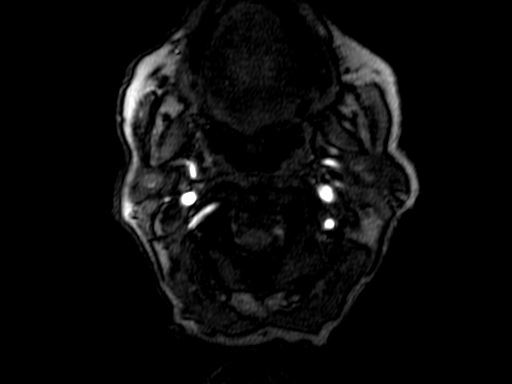
[im 71/80]
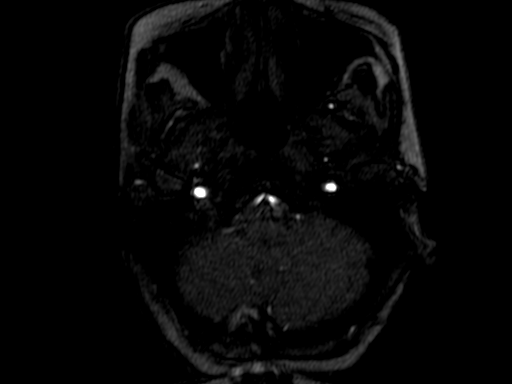
[im 80/80]
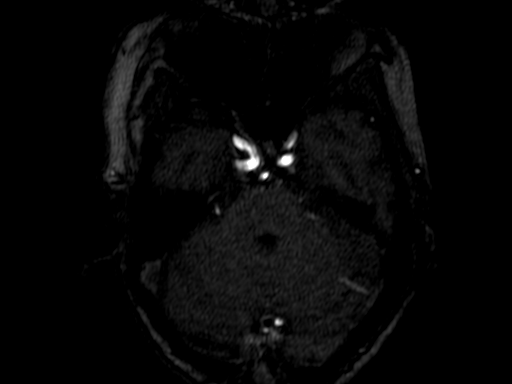

[Series 5: t1_axial_fs · axial · 5.0mm · 0.49mm/px · z∈[-78,+96]mm · 4 of 30 slices shown]
[im 1/30]
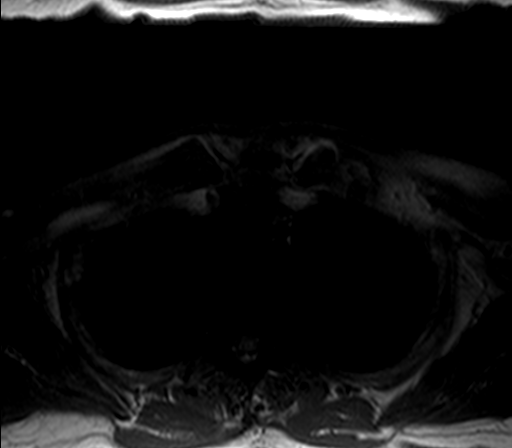
[im 10/30]
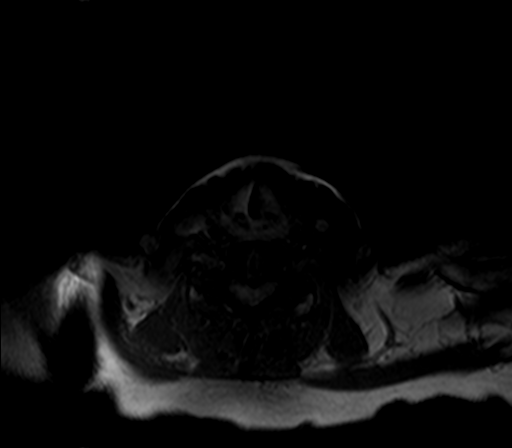
[im 20/30]
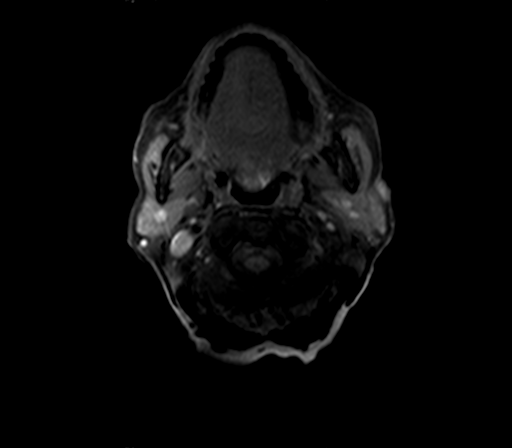
[im 30/30]
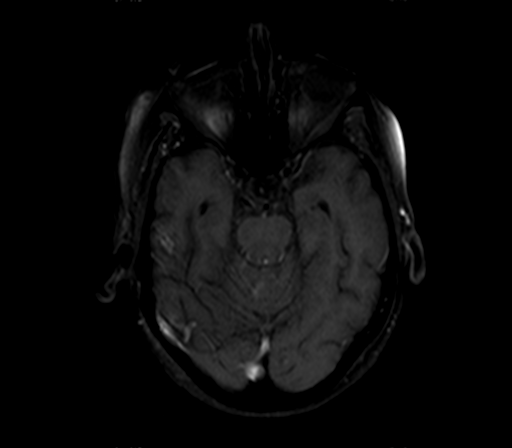

[Series 10: fl3d_ce_cor_arterial · coronal · 1.0mm · 0.46mm/px · 3 of 80 slices shown]
[im 9/80]
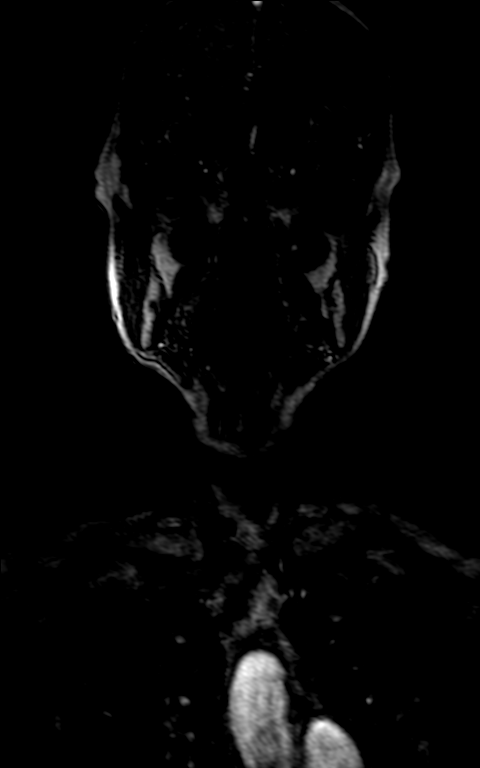
[im 44/80]
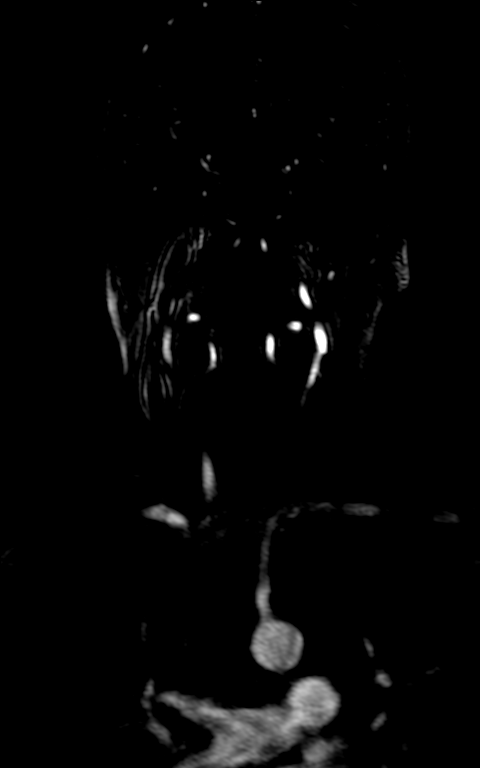
[im 71/80]
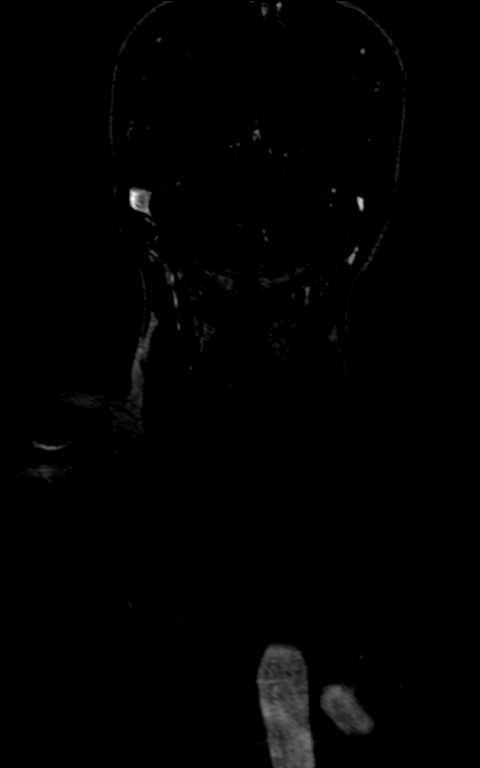

[Series 11: fl3d_ce_cor_arterial_sub · coronal · 1.0mm · 0.46mm/px · 3 of 80 slices shown]
[im 9/80]
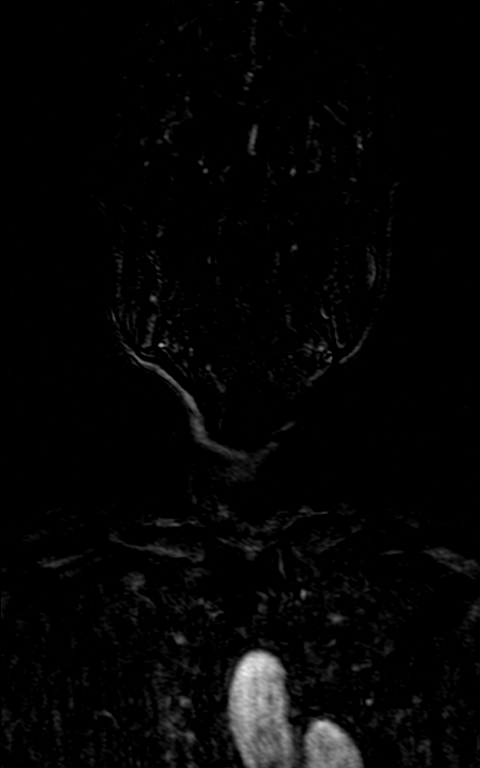
[im 44/80]
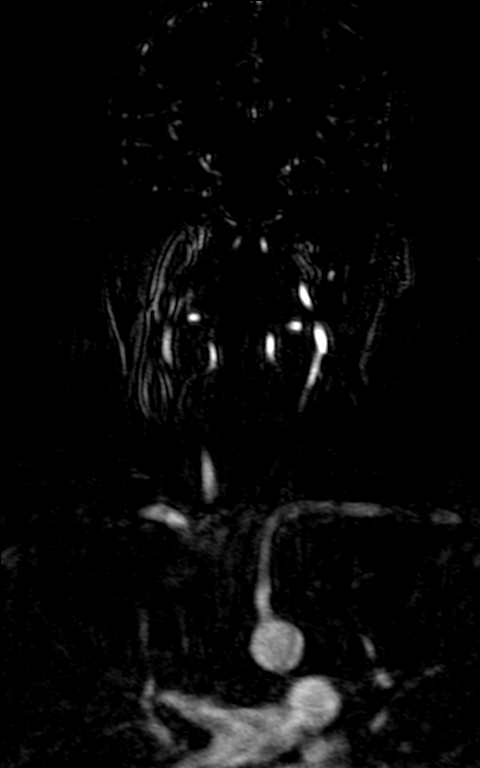
[im 71/80]
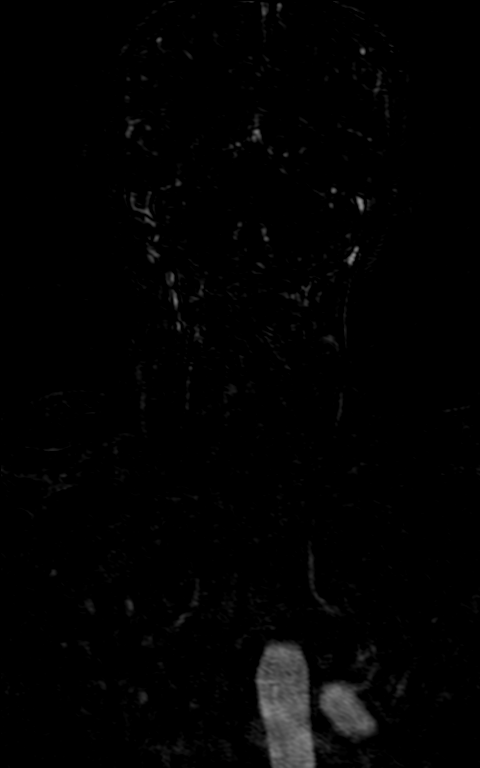

[18 of 48 positions shown; findings below may reference images not displayed]

FINDINGS: The noncontrast 2-D time-of-flight magnetic resonance angiogram of the neck vessels shows antegrade flow seen in the carotids and vertebral arteries.

The noncontrasted 3-D time-of-flight magnetic resonance angiogram of the brain shows normal distal cervical internal carotid arteries continue up into the brain bifurcating into normal caliber anterior middle cerebral arteries. No intracranial arterial stenosis, aneurysm or arteriovenous malformation is seen. The distal vertebral arteries are of normal caliber, the left one being the dominant one. Both join at the base of the skull to supply a normal caliber basilar artery. There is good crossflow seen through the right posterior communicating artery. No intracranial arterial stenosis, aneurysm or arteriovenous malformation is seen.

The postcontrast 3-D time-of-flight magnetic resonance angiogram of the neck and brain shows normal caliber aortic arch with common origin of the left carotid artery from the brachiocephalic artery which is a normal variant. The brachiocephalic and left subclavian artery are seen arising from the aortic arch with no evidence of stenosis. The brachiocephalic, both subclavian, both common carotids and both vertebral arteries are of normal caliber. In the right side the right common carotid arteries bifurcate into normal caliber external and cervical internal carotid arteries continue up into the brain without stenosis bifurcating into normal caliber anterior middle cerebral arteries and with good crossflow seen through the right posterior communicating artery. The left common carotid artery bifurcate into normal caliber external carotid artery. Atherosclerotic plaquing is seen at the origin of the left cervical internal carotid artery producing an approximately 75% circumferential stenosis measuring approximately 13 mm long. The left cervical internal carotid arteries then continue up into the brain bifurcating into normal caliber anterior middle cerebral arteries with no significant intracranial arterial stenosis, aneurysm or arteriovenous malformation.
IMPRESSION: 1.
Normal caliber aortic arch with common origin of the left carotid artery from the brachiocephalic artery (normal variant). No stenosis of the great vessels arising from the aortic arch is seen.

2.
Approximately 75% circumferential stenosis of the proximal left cervical internal carotid artery.

3.
No intracranial arterial stenosis, aneurysm or arteriovenous malformation is seen.

## 2021-04-19 IMAGING — MR MRA HEAD WO/W CONTRAST
4 series · 40 of 48 positions shown · IV contrast (gadolinium)
Comparison: Correlation is made to previous bilateral carotid Doppler ultrasound April 05, 2021

INDICATION: Bilateral carotid bruit with approximately 50-70% stenosis of the left cervical internal carotid artery and 50% stenosis of the right cervical internal carotid artery by previous carotid Doppler ultrasound April 05, 2021.
TECHNIQUE: MRA of the neck and brain without and with intravenous contrast. Two-dimension time-of-flight MR angiography of the neck arteries and the intracranial arteries are performed without IV contrast.  This is followed by a 3D gadolinium bolus enhanced MR angiogram of the neck and brain using 20 cc IV ProHance. 2D reconstruction including coronal and sagittal images and 3D reconstruction using MIP and volume rendering algorithms are performed.

[Series 1: bSSFP · axial · 10.0mm · 1.17mm/px · z∈[-40,+150]mm · 3 of 15 slices shown]
[im 1/15]
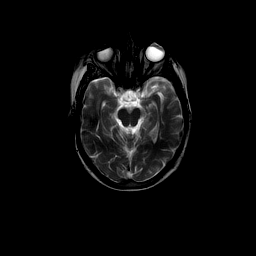
[im 8/15]
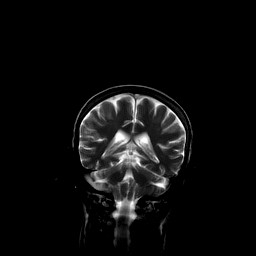
[im 15/15]
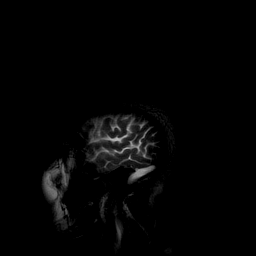

[Series 3: t1_sag · sagittal · 5.0mm · 0.45mm/px · 6 of 25 slices shown]
[im 1/25]
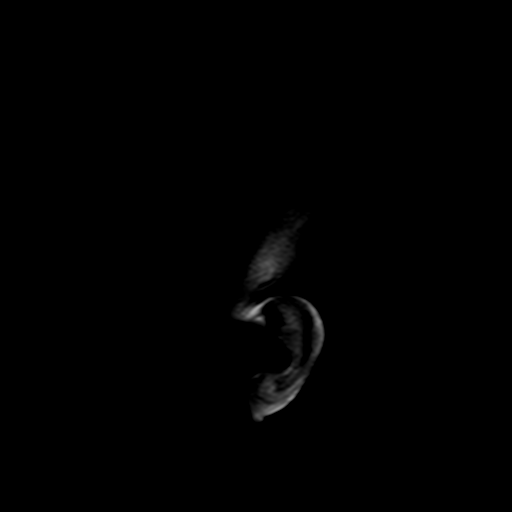
[im 5/25]
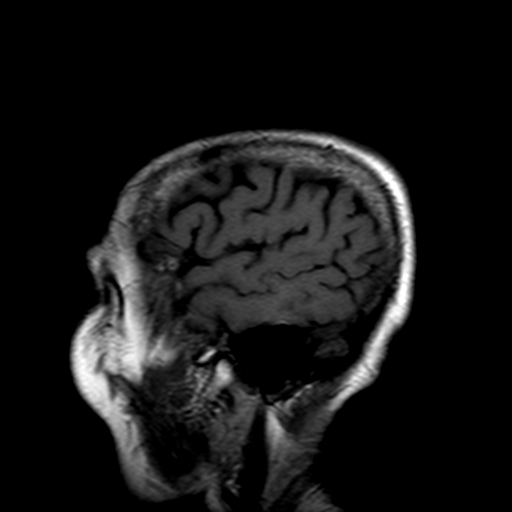
[im 10/25]
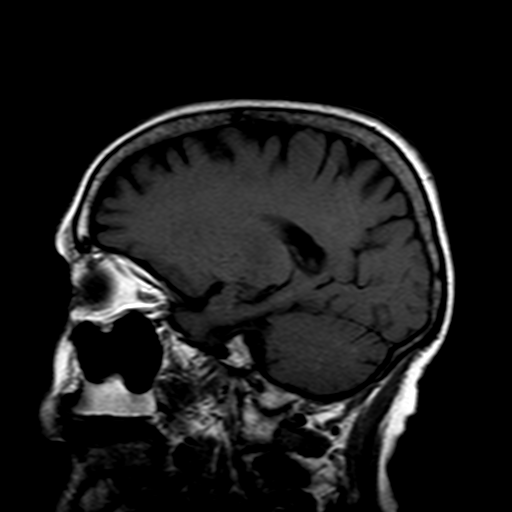
[im 15/25]
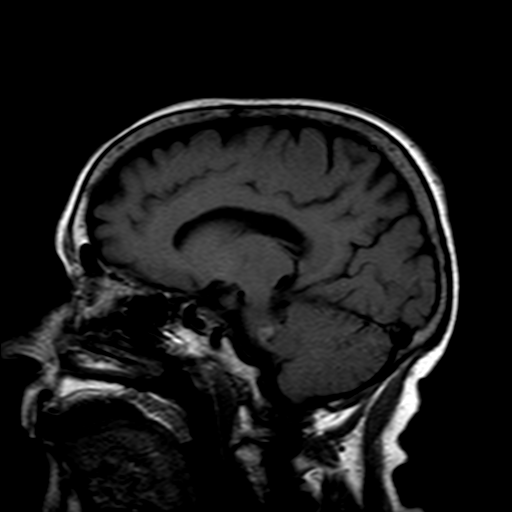
[im 20/25]
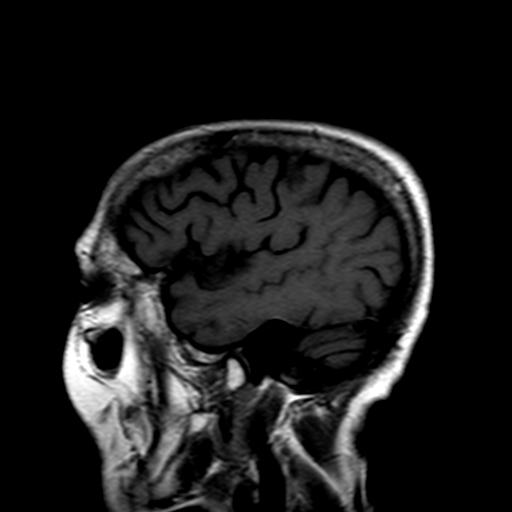
[im 25/25]
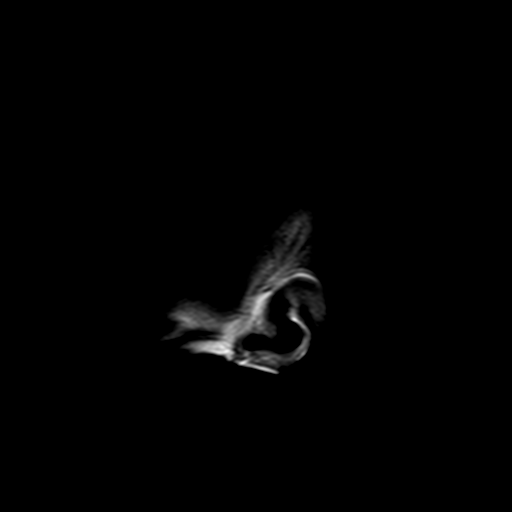

[Series 4: tof_3d_cow · axial · 0.8mm · 0.86mm/px · z∈[-36,+61]mm · 25 of 141 slices shown]
[im 1/141]
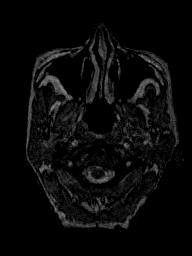
[im 5/141]
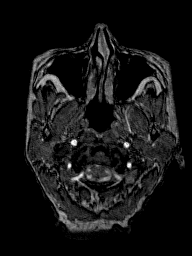
[im 9/141]
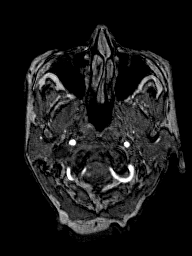
[im 14/141]
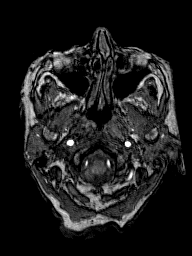
[im 18/141]
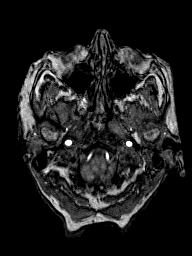
[im 22/141]
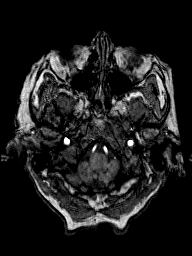
[im 27/141]
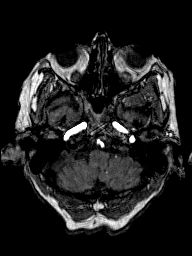
[im 31/141]
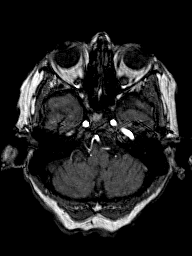
[im 36/141]
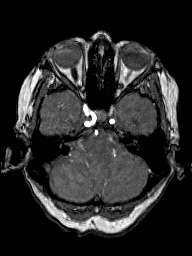
[im 40/141]
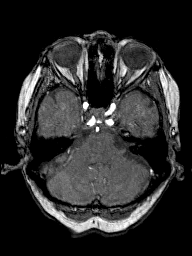
[im 44/141]
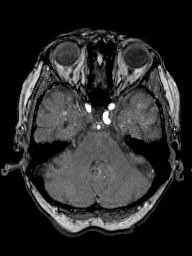
[im 49/141]
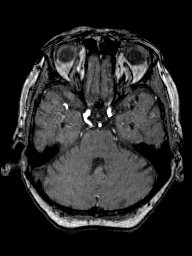
[im 53/141]
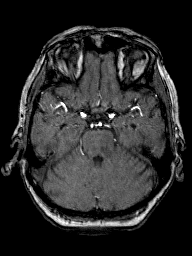
[im 57/141]
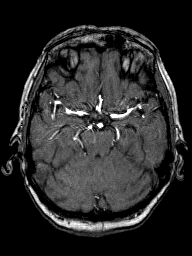
[im 62/141]
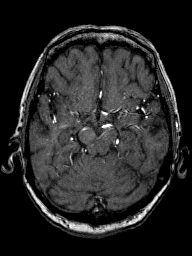
[im 66/141]
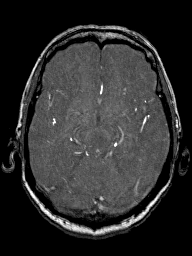
[im 71/141]
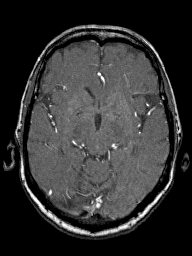
[im 75/141]
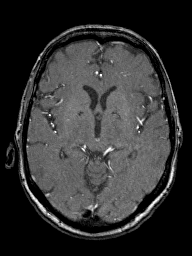
[im 79/141]
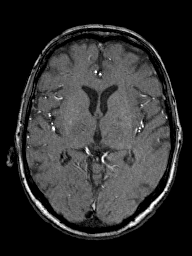
[im 84/141]
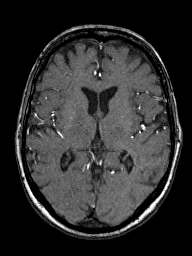
[im 88/141]
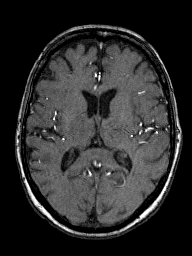
[im 97/141]
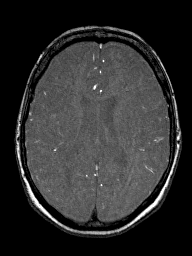
[im 114/141]
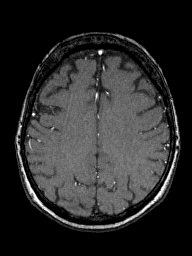
[im 119/141]
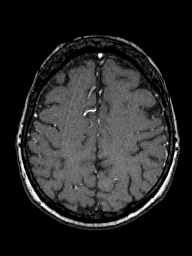
[im 132/141]
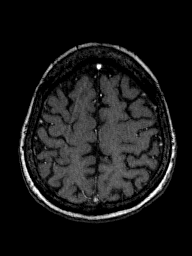

[Series 6: t1_axial_fs · axial · 4.0mm · 0.43mm/px · z∈[-31,+65]mm · 6 of 25 slices shown]
[im 1/25]
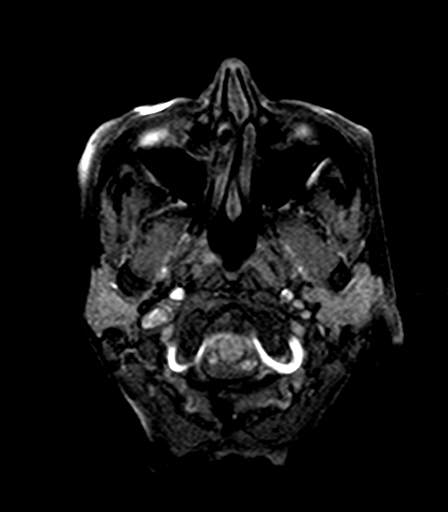
[im 5/25]
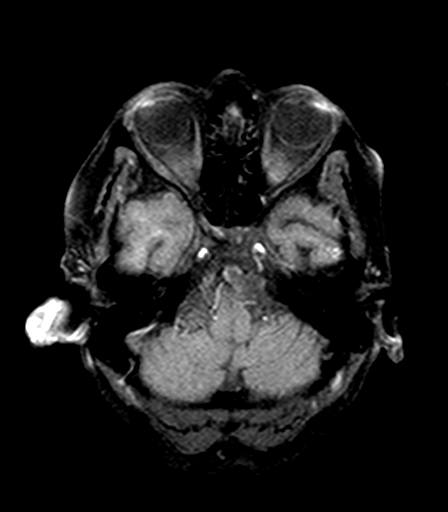
[im 10/25]
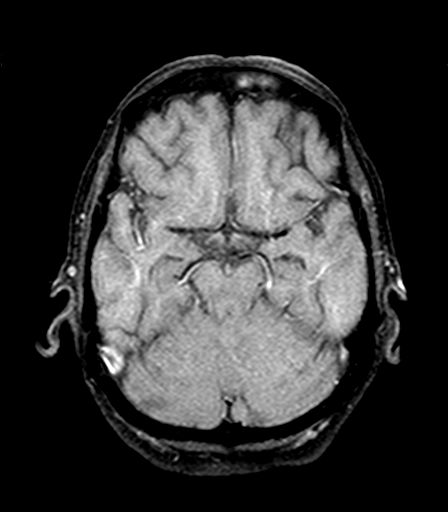
[im 15/25]
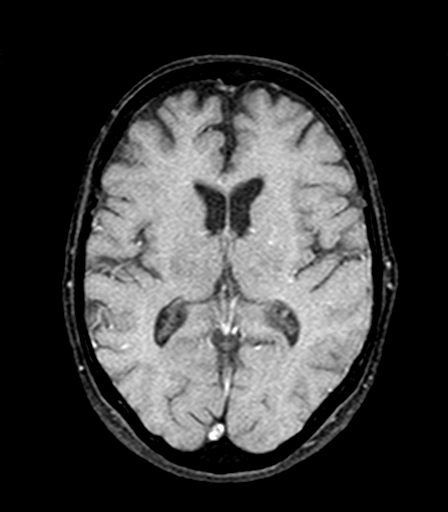
[im 20/25]
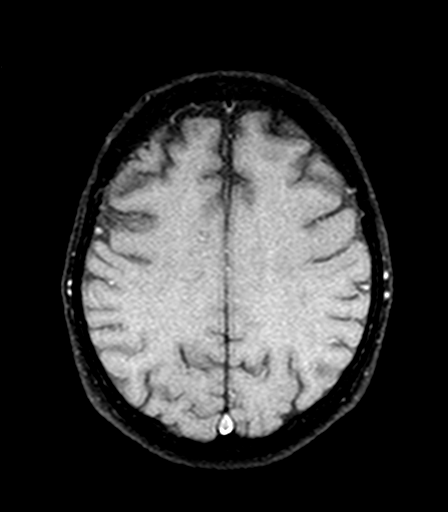
[im 25/25]
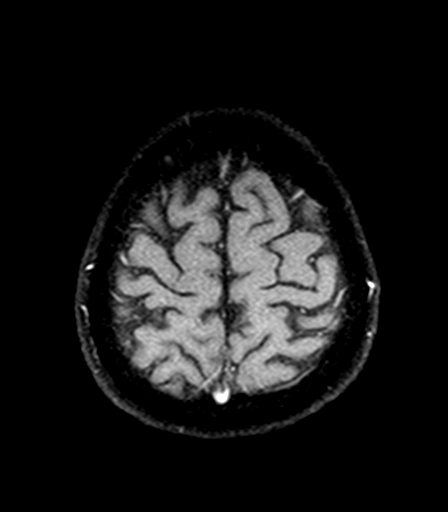

[40 of 48 positions shown; findings below may reference images not displayed]

FINDINGS: The noncontrast 2-D time-of-flight magnetic resonance angiogram of the neck vessels shows antegrade flow seen in the carotids and vertebral arteries.

The noncontrasted 3-D time-of-flight magnetic resonance angiogram of the brain shows normal distal cervical internal carotid arteries continue up into the brain bifurcating into normal caliber anterior middle cerebral arteries. No intracranial arterial stenosis, aneurysm or arteriovenous malformation is seen. The distal vertebral arteries are of normal caliber, the left one being the dominant one. Both join at the base of the skull to supply a normal caliber basilar artery. There is good crossflow seen through the right posterior communicating artery. No intracranial arterial stenosis, aneurysm or arteriovenous malformation is seen.

The postcontrast 3-D time-of-flight magnetic resonance angiogram of the neck and brain shows normal caliber aortic arch with common origin of the left carotid artery from the brachiocephalic artery which is a normal variant. The brachiocephalic and left subclavian artery are seen arising from the aortic arch with no evidence of stenosis. The brachiocephalic, both subclavian, both common carotids and both vertebral arteries are of normal caliber. In the right side the right common carotid arteries bifurcate into normal caliber external and cervical internal carotid arteries continue up into the brain without stenosis bifurcating into normal caliber anterior middle cerebral arteries and with good crossflow seen through the right posterior communicating artery. The left common carotid artery bifurcate into normal caliber external carotid artery. Atherosclerotic plaquing is seen at the origin of the left cervical internal carotid artery producing an approximately 75% circumferential stenosis measuring approximately 13 mm long. The left cervical internal carotid arteries then continue up into the brain bifurcating into normal caliber anterior middle cerebral arteries with no significant intracranial arterial stenosis, aneurysm or arteriovenous malformation.
IMPRESSION: 1.
Normal caliber aortic arch with common origin of the left carotid artery from the brachiocephalic artery (normal variant). No stenosis of the great vessels arising from the aortic arch is seen.

2.
Approximately 75% circumferential stenosis of the proximal left cervical internal carotid artery.

3.
No intracranial arterial stenosis, aneurysm or arteriovenous malformation is seen.

## 2021-11-03 IMAGING — CT PET CT SKULL BASE TO THIGH_STAGING
1 of 3 series · 1 of 25 positions shown · non-contrast
Comparison: CT abdomen and pelvis 10/12/2021

Images Obtained from [facility]
Height: 61 inches. Weight: 116 pounds.
INDICATION: Ovarian cancer, staging
TECHNIQUE: The patient's serum glucose was 176 mg/dL at time of the study.  The patient was intravenously injected with 11.75 mCi mCi F-18 Fluorodeoxyglucose in a right antecubital area vein. The
patient rested quietly for 58 minutes and then received attenuation corrected PET/CT imaging with Time of Flight Protocol from the skull base to mid thigh. PET, CT, and fused images were reviewed at
the reading station. SUV is corrected based on lean body mass. Images are adequate for review.

[Series 4: ct pet 4.0 br38 · axial · 4.0mm · 0.98mm/px · 1 of 278 slices shown]
[im 278/278  brain]
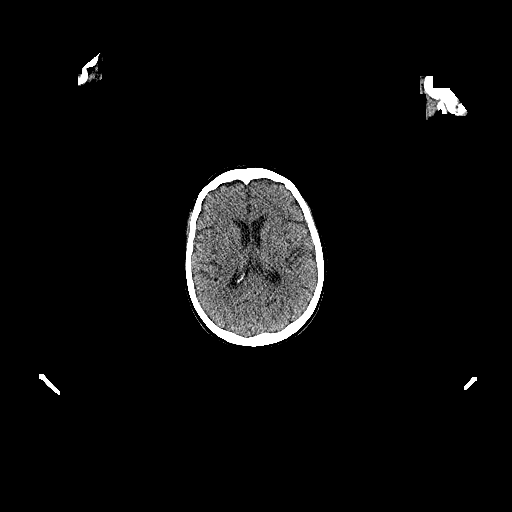

[1 of 25 positions shown; findings below may reference images not displayed]

FINDINGS: Mediastinum SUV max:
Liver SUV mean:
PERCIST threshold:
HEAD/NECK:
PET: There is an FDG avid node in the left lower neck measuring 0.9 cm (image 64) SUV max
CT:  Intracranial structures are unremarkable. Paranasal sinuses and mastoid air cells are clear. Patient's teeth are absent. Patient is status post bilateral lens transplant/cataract surgery.
THORAX:
PET: The background mediastinal SUV max: 1.89 .There are abnormal FDG avid mediastinal and hilar lymph nodes. For example as follows:
***The mass, conglomerate adenopathy, in the left mediastinum and region of the AP window measuring 7.4 x 3.4 cm (image 91) with an SUV max of
2.1 x 1.3 cm superior mediastinal lymph node (image 74) with SUV max of
Right hilar 1.1 cm right hilar lymph node (image 99), with SUV max
There are a few subcentimeter pulmonary nodules without increased FDG activity. For example there is a left upper lobe 3 mm nodule (image 83).
CT: There are emphysematous changes in the lungs. There is coronary artery calcification.
ABDOMEN/PELVIS:
PET:  Liver SUV mean:
PERCIST threshold:
There are FDG avid bilateral right and left hemipelvis masses which would be consistent with a history of ovarian cancer. The right hemipelvis mass measures approximately 4.3 x 3.9 cm (image 218) and
the left hemipelvis mass measures approximately 4.2 x 3.7 cm (image 224) with SUV max of 8.95 and 10.65 respectively.
There is a small amount of ascites in the pelvis.
There are FDG avid para-aortic nodes. For example, there is a left para-aortic node measuring approximately 1.0 cm (image 179) with an SUV max of 7.11.
CT: There is a prominent gastric fundal diverticulum. Patient appears status post cholecystectomy. No hydronephrosis is seen in either kidney. There is descending and sigmoid colon diverticulosis
coli. There are small bilateral fat-containing inguinal hernias.
SKELETAL:
PET: No intense asymmetric FDG activity.
CT: None.
IMPRESSION: 1. Today's PET/CT is compared to CT abdomen and pelvis exam 10/12/2021. There are FDG avid masses in the right and left hemipelvis consistent with primary ovarian neoplasm involving both ovaries with
findings of metastatic adenopathy in the retroperitoneum/para-aortic region, as well as the chest and left lower neck. FIGO stage IV.
2.  Additional comments and findings as above.

## 2022-04-09 IMAGING — PT PET CT SKULL BASE TO THIGH_RESTAGING
1 of 3 series · 1 of 25 positions shown · non-contrast
Comparison: CT chest abdomen and pelvis, 01/30/22; PET scan, 11/03/21.

Images Obtained from [facility]
Height: 61 inches. Weight: 111 pounds.
INDICATION: Ovarian carcinoma, diagnosis October 2021; restaging.
TECHNIQUE: The patient's serum glucose was 97 mg/dL at time of the study.  The patient was intravenously injected with 11.5 mCi F-18 Fluorodeoxyglucose in a right antecubital vein.  The patient
rested quietly for 68 minutes and then received attenuation corrected PET/CT imaging with Time of Flight Protocol from the skull base to mid thigh. PET, CT, and fused images were reviewed at the
reading station. SUV is corrected based on lean body mass. Images are adequate for review.

[Series 4: ct pet 4.0 br38 · axial · 4.0mm · 0.98mm/px · 1 of 278 slices shown]
[im 278/278  brain]
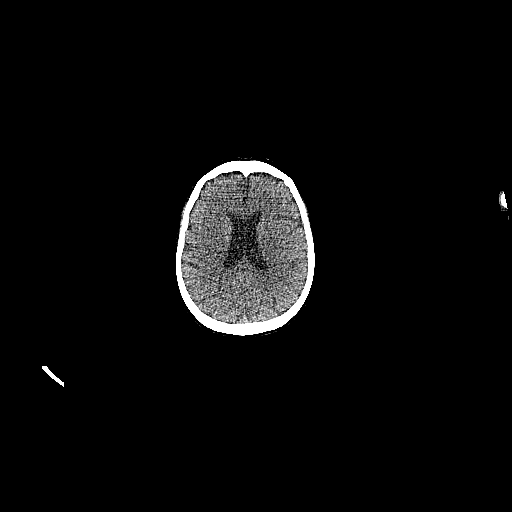

[1 of 25 positions shown; findings below may reference images not displayed]

FINDINGS: HEAD/NECK:
PET: There is normal uptake of radiotracer in the caudal margin of brain and in the head and neck region with no evidence of primary or metastatic malignancy. The previous exam showed a single lymph
node in the left supraclavicular fossa which had standard uptake of 6.2, previous 1.8, and on today's CT study measuring 3 mm in short axis diameter and previously measured 9 mm on CT performed with
PET.
CT:  The caudal margin of brain is normal. Orbits and paranasal sinuses are normal. No soft tissue neck or thoracic inlet abnormality is seen. Left subclavian Mediport catheter is in good position.
No new lymphadenopathy is seen.
THORAX:
PET: The background mediastinal maximum SUV is 1.6. There is normal uptake of radiotracer in the chest wall and axillary regions. Previous exam showed conglomerate nodal mass lateral to the aortic
arch which showed avid FDG activity of 12.6, but uptake is markedly diminished measuring 3.9 today's study with conglomerate nodal mass on image 89 measuring 28 x 12 mm, measured 34 x 15 mm on
01/30/22 CT and measured 74 x 34 mm on 11/03/21 CT PET.
Additional lymphadenopathy which was present on previous PET in the right hilar region is not FDG avid above background today. Low-level activity in the right hilus on image 104 shows standard uptake
of 3.8 and previously measured 6.0 on PET scan of 11/03/21. An adjacent retrocaval node at the same CT level level is seen with standard uptake of 2.9 today, and was previously 7.6 with node
measuring 8 mm on today's CT, measured 11 mm on 01/30/22 and measured 14 mm on 11/03/21 PET scan.
Slightly greater diffuse esophageal activity is seen and may be related to GERD. No abnormal pulmonary activity is present.
CT: The chest wall, axillary regions and mediastinum are normal except for the aforementioned regressing lymphadenopathy. Three-vessel coronary vascular calcifications are seen. Heart size is normal
and no hiatal hernia is present. No pulmonary findings are seen on CT. Panlobular emphysema is again noted.
ABDOMEN/PELVIS:
PET:  The background liver mean SUV is  2.0. PERCIST Threshold is 2.7. There is normal uptake of radiotracer in the liver and spleen. There is diffuse increased uptake in the colon without focal
activity to indicate pathology. Previous PET scan showed uptake in upper retroperitoneal lymph nodes which on today's exam show no activity above background. No abnormal pelvic floor activity or
activity in the lower periaortic or iliofemoral lymph node chain are seen. No uptake related to omental metastases are noted.
CT: The liver and spleen are normal. Biliary system is normal status post cholecystectomy. Pancreas, adrenal glands, kidneys and retroperitoneum are normal. FDG avid lymphadenopathy was identified on
previous CT in the lower left periaortic space with lymph node measuring 10 mm, which was 6 x 9 mm on 01/30/22 CT and corresponding lymph node is not seen on today's CT. Moderate inspissated stool is
seen in the colon with diverticulosis of left colon without diverticulitis. Atrophic uterus and adnexa are seen. No ovarian mass is present. No inguinal findings are seen.
SKELETAL:
PET: There is normal uptake of radiotracer in the skeletal system on PET with no evidence of metastatic bone disease or other osseous findings.
CT: Bone demineralization is present with mild degenerative change in the lower lumbar spine but no additional findings are present.
IMPRESSION: 1. Today's PET/CT is compared to the chest abdomen and pelvis dated 01/30/22 and PET scan of 10/12/21. There has been significant metabolic improvement in metastatic lymph nodes located in the
mediastinum and upper abdomen compared to PET scan of 11/03/21 with 01/30/22 CT showing further anatomic regression. No new disease is present.
2.  Findings are consistent with high-grade partial response.
3. Miscellaneous findings of diverticulosis of left colon, vascular calcifications as noted above and bone demineralization with degenerative change in spine.

## 2022-09-24 IMAGING — CT PET CT SKULL BASE TO THIGH_RESTAGING
1 of 3 series · 1 of 25 positions shown · non-contrast
Comparison: PET/CT 04/09/22, MRI brain 06/27/22

Images Obtained from [facility]
Height: 61 inches. Weight: 112 pounds.
INDICATION: Ovarian cancer, restaging.
TECHNIQUE: The patient's serum glucose was 102 mg/dL at time of the study.  The patient was intravenously injected with 12.33 mCi F-18 Fluorodeoxyglucose in a Right Antecubital Area vein. The patient
rested quietly for 62 Minutes and then received attenuation corrected PET/CT imaging with Time of Flight Protocol from the skull base to mid thigh. PET, CT, and fused images were reviewed at the
reading station. SUV is corrected based on lean body mass. Images are adequate for review.

[Series 3: ct pet 4.0 br38 · axial · 4.0mm · 0.98mm/px · 1 of 278 slices shown]
[im 278/278  brain]
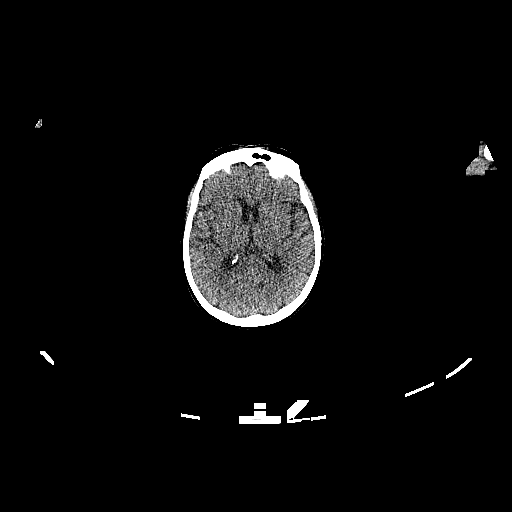

[1 of 25 positions shown; findings below may reference images not displayed]

FINDINGS: Mediastinum SUV max:
Liver SUV mean:
PERCIST threshold:
PRIMARY TUMOR:
*  No malignant uptake
LYMPH NODES:
*  Further decrease in metabolic activity in para-aortic and subaortic lymph nodes
*  0.6 cm in thickness on CT with SUV max 2.5 (87), previously 0.6 cm with SUV max
*  Increase in metabolic activity in remaining mediastinal lymph nodes. For example:
*  1.1 cm AP window node with SUV max 4.3 (91), previously 1.0 cm with SUV max
*  1.0 cm subcarinal node with SUV max 5.3 (96), previously 0.9 cm with SUV max
*  No significant change in metabolic activity in hilar lymph nodes. For example:
*  Right hilum with SUV max 3.3 (99), previously
*  New minimally hypermetabolic left axillary lymph nodes
*  Most intense uptake in a 1.4 cm node with SUV max 2.9 (69), previously 1.1 cm with SUV max
METASTASES:
ADDITIONAL METABOLIC FINDINGS:
*  Low-grade FDG uptake in a left lower para-aortic node measuring 1.0 cm with SUV max 2.1 (181), previously 0.9 cm with SUV max
*  Diffuse increased activity in the esophagus, likely inflammatory
*  Degenerative type activity in the right C3-C4 facet joint
OTHER FINDINGS:
*  Left carotid artery stent
*  Left Mediport
*  Moderate coronary artery calcifications
*  Moderate emphysema
*  Stable 0.3 cm subpleural right upper lobe nodule (77)
*  Posterior gastric fundal diverticulum
*  Diverticulosis of the colon
*  Moderate atherosclerosis
IMPRESSION: 1. Today's PET/CT is compared to PET/CT 04/09/22, MRI brain 06/27/22. Findings suggest mixed response. Decrease in metabolic activity in para-aortic/subaortic lymph nodes and increase in metabolic
activity in remaining mediastinal lymph nodes. No significant change in hilar lymph nodes.
2. New minimally hypermetabolic left axillary lymph nodes, likely reactive.
3. Increased low-grade FDG uptake in a small left para-aortic lymph node in the lower abdomen, probably benign. Suggest attention on follow-up.

## 2023-04-09 DEATH — deceased
